# Patient Record
Sex: Male | Born: 1945 | Race: Black or African American | Hispanic: No | Marital: Single | Smoking: Current every day smoker
Health system: Southern US, Community
[De-identification: ages and names within clinical notes are randomized; demographics above are authoritative.]

## PROBLEM LIST (undated history)

## (undated) DIAGNOSIS — I255 Ischemic cardiomyopathy: Secondary | ICD-10-CM

## (undated) DIAGNOSIS — R7303 Prediabetes: Secondary | ICD-10-CM

## (undated) DIAGNOSIS — I10 Essential (primary) hypertension: Secondary | ICD-10-CM

## (undated) DIAGNOSIS — I2510 Atherosclerotic heart disease of native coronary artery without angina pectoris: Secondary | ICD-10-CM

## (undated) DIAGNOSIS — Z720 Tobacco use: Secondary | ICD-10-CM

## (undated) DIAGNOSIS — E785 Hyperlipidemia, unspecified: Secondary | ICD-10-CM

## (undated) HISTORY — PX: OTHER SURGICAL HISTORY: SHX169

## (undated) HISTORY — PX: FEMUR FRACTURE SURGERY: SHX633

## (undated) MED FILL — Fosaprepitant Dimeglumine For IV Infusion 150 MG (Base Eq): INTRAVENOUS | Qty: 5 | Status: AC

## (undated) MED FILL — Dexamethasone Sodium Phosphate Inj 100 MG/10ML: INTRAMUSCULAR | Qty: 1 | Status: AC

---

## 1998-10-07 ENCOUNTER — Encounter: Payer: Self-pay | Admitting: Emergency Medicine

## 1998-10-07 ENCOUNTER — Emergency Department (HOSPITAL_COMMUNITY): Admission: EM | Admit: 1998-10-07 | Discharge: 1998-10-07 | Payer: Self-pay | Admitting: Emergency Medicine

## 1998-10-19 ENCOUNTER — Emergency Department (HOSPITAL_COMMUNITY): Admission: EM | Admit: 1998-10-19 | Discharge: 1998-10-19 | Payer: Self-pay | Admitting: Emergency Medicine

## 1998-11-06 ENCOUNTER — Ambulatory Visit (HOSPITAL_BASED_OUTPATIENT_CLINIC_OR_DEPARTMENT_OTHER): Admission: RE | Admit: 1998-11-06 | Discharge: 1998-11-06 | Payer: Self-pay | Admitting: General Surgery

## 1998-11-12 ENCOUNTER — Emergency Department (HOSPITAL_COMMUNITY): Admission: EM | Admit: 1998-11-12 | Discharge: 1998-11-12 | Payer: Self-pay | Admitting: Emergency Medicine

## 2000-04-06 ENCOUNTER — Emergency Department (HOSPITAL_COMMUNITY): Admission: EM | Admit: 2000-04-06 | Discharge: 2000-04-06 | Payer: Self-pay | Admitting: Emergency Medicine

## 2001-03-27 ENCOUNTER — Emergency Department (HOSPITAL_COMMUNITY): Admission: EM | Admit: 2001-03-27 | Discharge: 2001-03-27 | Payer: Self-pay | Admitting: Emergency Medicine

## 2001-04-17 ENCOUNTER — Emergency Department (HOSPITAL_COMMUNITY): Admission: EM | Admit: 2001-04-17 | Discharge: 2001-04-17 | Payer: Self-pay | Admitting: Emergency Medicine

## 2001-09-07 ENCOUNTER — Emergency Department (HOSPITAL_COMMUNITY): Admission: EM | Admit: 2001-09-07 | Discharge: 2001-09-07 | Payer: Self-pay | Admitting: *Deleted

## 2002-06-25 ENCOUNTER — Emergency Department (HOSPITAL_COMMUNITY): Admission: EM | Admit: 2002-06-25 | Discharge: 2002-06-25 | Payer: Self-pay | Admitting: Emergency Medicine

## 2002-06-25 ENCOUNTER — Encounter: Payer: Self-pay | Admitting: Emergency Medicine

## 2003-03-11 ENCOUNTER — Emergency Department (HOSPITAL_COMMUNITY): Admission: EM | Admit: 2003-03-11 | Discharge: 2003-03-11 | Payer: Self-pay | Admitting: Emergency Medicine

## 2004-11-10 ENCOUNTER — Emergency Department (HOSPITAL_COMMUNITY): Admission: EM | Admit: 2004-11-10 | Discharge: 2004-11-10 | Payer: Self-pay | Admitting: Family Medicine

## 2005-03-11 ENCOUNTER — Encounter: Admission: RE | Admit: 2005-03-11 | Discharge: 2005-03-11 | Payer: Self-pay | Admitting: Family Medicine

## 2006-06-22 ENCOUNTER — Emergency Department (HOSPITAL_COMMUNITY): Admission: EM | Admit: 2006-06-22 | Discharge: 2006-06-22 | Payer: Self-pay | Admitting: Emergency Medicine

## 2007-10-25 ENCOUNTER — Ambulatory Visit (HOSPITAL_COMMUNITY): Admission: RE | Admit: 2007-10-25 | Discharge: 2007-10-25 | Payer: Self-pay | Admitting: Chiropractic Medicine

## 2013-07-17 ENCOUNTER — Emergency Department (HOSPITAL_COMMUNITY)
Admission: EM | Admit: 2013-07-17 | Discharge: 2013-07-17 | Disposition: A | Discharge status: Home / Self Care | Payer: Medicare PPO | Attending: Emergency Medicine | Admitting: Emergency Medicine

## 2013-07-17 ENCOUNTER — Encounter (HOSPITAL_COMMUNITY): Payer: Self-pay | Admitting: Emergency Medicine

## 2013-07-17 ENCOUNTER — Emergency Department (HOSPITAL_COMMUNITY): Payer: Medicare PPO

## 2013-07-17 DIAGNOSIS — F17200 Nicotine dependence, unspecified, uncomplicated: Secondary | ICD-10-CM | POA: Insufficient documentation

## 2013-07-17 DIAGNOSIS — M25579 Pain in unspecified ankle and joints of unspecified foot: Secondary | ICD-10-CM | POA: Insufficient documentation

## 2013-07-17 DIAGNOSIS — M25476 Effusion, unspecified foot: Secondary | ICD-10-CM | POA: Insufficient documentation

## 2013-07-17 DIAGNOSIS — M25571 Pain in right ankle and joints of right foot: Secondary | ICD-10-CM

## 2013-07-17 DIAGNOSIS — M25473 Effusion, unspecified ankle: Secondary | ICD-10-CM | POA: Insufficient documentation

## 2013-07-17 MED ORDER — HYDROCODONE-ACETAMINOPHEN 5-325 MG PO TABS: 1.0000 | ORAL_TABLET | ORAL | Status: DC | PRN

## 2013-07-17 NOTE — ED Notes (Signed)
Patient states he started having R ankle pain Sunday afternoon.   Patient states swelling along with pain.   Patient denies injury.   Patient denies any other symptoms.

## 2013-07-17 NOTE — Progress Notes (Signed)
Orthopedic Tech Progress Note Patient Details:  Frank Hart [birth year] [id number]  Ortho Devices Type of Ortho Device: ASO Ortho Device/Splint Interventions: Application   [name] 07/17/2013, 9:27 AM
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
## 2013-07-17 NOTE — ED Notes (Signed)
Per ortho tech, patient refused ASO - he has several at home.   Patient states he is ready to go home.

## 2013-07-17 NOTE — Progress Notes (Signed)
Orthopedic Tech Progress Note Patient Details:  Frank Hart 01-11-1946 [id number]  Patient ID: [name], male   DOB: 07/11/1946, 67 y.o.   MRN: [id number]   [name] 07/17/2013, 9:49 AMPatient refused ASO brace.

## 2014-05-21 ENCOUNTER — Emergency Department (HOSPITAL_COMMUNITY)
Admission: EM | Admit: 2014-05-21 | Discharge: 2014-05-21 | Disposition: A | Discharge status: Home / Self Care | Payer: Medicare PPO | Attending: Emergency Medicine | Admitting: Emergency Medicine

## 2014-05-21 ENCOUNTER — Emergency Department (HOSPITAL_COMMUNITY): Payer: Medicare PPO

## 2014-05-21 ENCOUNTER — Encounter (HOSPITAL_COMMUNITY): Payer: Self-pay | Admitting: Emergency Medicine

## 2014-05-21 DIAGNOSIS — M25512 Pain in left shoulder: Secondary | ICD-10-CM

## 2014-05-21 DIAGNOSIS — F17200 Nicotine dependence, unspecified, uncomplicated: Secondary | ICD-10-CM | POA: Insufficient documentation

## 2014-05-21 DIAGNOSIS — R0602 Shortness of breath: Secondary | ICD-10-CM | POA: Insufficient documentation

## 2014-05-21 DIAGNOSIS — M79609 Pain in unspecified limb: Secondary | ICD-10-CM | POA: Insufficient documentation

## 2014-05-21 DIAGNOSIS — M10071 Idiopathic gout, right ankle and foot: Secondary | ICD-10-CM

## 2014-05-21 DIAGNOSIS — M109 Gout, unspecified: Secondary | ICD-10-CM | POA: Insufficient documentation

## 2014-05-21 LAB — BASIC METABOLIC PANEL
BUN: 11 mg/dL (ref 6–23)
CALCIUM: 9.6 mg/dL (ref 8.4–10.5)
CO2: 24 meq/L (ref 19–32)
Chloride: 102 mEq/L (ref 96–112)
Creatinine, Ser: 1.48 mg/dL — ABNORMAL HIGH (ref 0.50–1.35)
GFR, EST AFRICAN AMERICAN: 54 mL/min — AB (ref >90)
GFR, EST NON AFRICAN AMERICAN: 47 mL/min — AB (ref >90)
Glucose, Bld: 152 mg/dL — ABNORMAL HIGH (ref 70–99)
Potassium: 4.8 mEq/L (ref 3.7–5.3)
SODIUM: 141 meq/L (ref 137–147)

## 2014-05-21 LAB — CBC WITH DIFFERENTIAL/PLATELET
BASOS ABS: 0 10*3/uL (ref 0.0–0.1)
Basophils Relative: 0 % (ref 0–1)
EOS ABS: 0.1 10*3/uL (ref 0.0–0.7)
EOS PCT: 1 % (ref 0–5)
HCT: 45.8 % (ref 39.0–52.0)
Hemoglobin: 15.1 g/dL (ref 13.0–17.0)
LYMPHS PCT: 19 % (ref 12–46)
Lymphs Abs: 2.5 10*3/uL (ref 0.7–4.0)
MCH: 28.1 pg (ref 26.0–34.0)
MCHC: 33 g/dL (ref 30.0–36.0)
MCV: 85.3 fL (ref 78.0–100.0)
Monocytes Absolute: 1.3 10*3/uL — ABNORMAL HIGH (ref 0.1–1.0)
Monocytes Relative: 10 % (ref 3–12)
Neutro Abs: 9.3 10*3/uL — ABNORMAL HIGH (ref 1.7–7.7)
Neutrophils Relative %: 70 % (ref 43–77)
PLATELETS: 222 10*3/uL (ref 150–400)
RBC: 5.37 MIL/uL (ref 4.22–5.81)
RDW: 13.7 % (ref 11.5–15.5)
WBC: 13.2 10*3/uL — AB (ref 4.0–10.5)

## 2014-05-21 LAB — SEDIMENTATION RATE: SED RATE: 13 mm/h (ref 0–16)

## 2014-05-21 MED ORDER — NAPROXEN 500 MG PO TABS: 500.0000 mg | ORAL_TABLET | Freq: Two times a day (BID) | ORAL | Status: DC

## 2014-05-21 MED ORDER — OXYCODONE-ACETAMINOPHEN 5-325 MG PO TABS
2.0000 | ORAL_TABLET | Freq: Once | ORAL | Status: AC
  Administered 2014-05-21: 2 via ORAL
  Filled 2014-05-21: qty 2

## 2014-05-21 MED ORDER — COLCHICINE 0.6 MG PO TABS
1.2000 mg | ORAL_TABLET | Freq: Once | ORAL | Status: AC
  Administered 2014-05-21: 1.2 mg via ORAL
  Filled 2014-05-21: qty 2

## 2014-05-21 MED ORDER — HYDROCODONE-ACETAMINOPHEN 5-325 MG PO TABS: 2.0000 | ORAL_TABLET | ORAL | Status: DC | PRN

## 2014-05-21 NOTE — Discharge Planning (Signed)
[facility] Liaison  PA asked if I could speak with the patient to assist in finding a primary care provider. Patient was given a list of providers in the area who accept his insurance. I also informed patient to contact some of the practices on the list to see which one's are accepting new patients. My contact information was also provided for any future questions or concerns.

## 2014-05-21 NOTE — Discharge Instructions (Signed)
Take Vicodin as needed for pain. Take Naprosyn as needed for gout flare. You may take these medications together. Refer to attached documents for more information. Follow up with Dr. Tonita Cong for further evaluation of your shoulder pain.    Emergency Department Resource Guide 1) Find a Doctor and Pay Out of Pocket Although you won't have to find out who is covered by your insurance plan, it is a good idea to ask around and get recommendations. You will then need to call the office and see if the doctor you have chosen will accept you as a new patient and what types of options they offer for patients who are self-pay. Some doctors offer discounts or will set up payment plans for their patients who do not have insurance, but you will need to ask so you aren't surprised when you get to your appointment.  2) Contact Your Local Health Department Not all health departments have doctors that can see patients for sick visits, but many do, so it is worth a call to see if yours does. If you don't know where your local health department is, you can check in your phone book. The CDC also has a tool to help you locate your state's health department, and many state websites also have listings of all of their local health departments.  3) Find a [facility] If your illness is not likely to be very severe or complicated, you may want to try a walk in clinic. These are popping up all over the country in pharmacies, drugstores, and shopping centers. They're usually staffed by nurse practitioners or physician assistants that have been trained to treat common illnesses and complaints. They're usually fairly quick and inexpensive. However, if you have serious medical issues or chronic medical problems, these are probably not your best option.  No Primary Care Doctor: - Call Health Connect at  [phone] - they can help you locate a primary care doctor that  accepts your insurance, provides certain services, etc. - Physician  Referral Service- [phone]  Chronic Pain Problems: Organization         Address  Phone   Notes  [facility]  [phone] Patients need to be referred by their primary care doctor.   Medication Assistance: Organization         Address  Phone   Notes  [facility] Medication [facility] [address] [place], [place] [address] [phone] --Must be a resident of [facility] -- Must have NO insurance coverage whatsoever (no Medicaid/ Medicare, etc.) -- The pt. MUST have a primary care doctor that directs their care regularly and follows them in the community   MedAssist  [phone]   [employer]  [phone]    Agencies that provide inexpensive medical care: Organization         Address  Phone   Notes  [place]  [phone]   [facility]    [phone]   [facility] [place], [place] [address] [phone]   [place] [address], [place] [phone]   Planned Parenthood    [phone]   [facility]    [phone]   [place] and [place] [address],  Phone:  [phone], Fax:  [phone] Hours of Operation:  9 am - 6 pm, M-F.  Also accepts Medicaid/Medicare and self-pay.  [facility] School for [place], [address], [place] Phone: [phone], Fax: [phone]. Hours of Operation:  8:30 am - 5:30 pm, M-F.  Also accepts Medicaid and self-pay.  [facility] [address] [place] Phone: [phone]   [place], [place], [place] [phone], Ext. [phone] Mondays & Thursdays: 7-9 AM.  First 15 patients are seen on a first come, first serve basis.    [place] Providers:  Organization         Address  Phone   Notes  [facility] [address], [place] [phone] Also accepts self-pay patients.  [facility] [address], [place]  [phone]   [place], [address], [place] [phone]   [facility] [address], [place] [phone]   Lucianne Lei [address], [place]   [phone] Only accepts [place] Access [place] patients after they have their name applied to their card.   Self-Pay (no insurance) in [facility]:  Organization         Address  Phone   Notes  Sickle Cell Patients, [facility] Internal Medicine [place] [phone]   [facility]gent Care [place] [phone]   [facility] [place]  [place], [place], [place] [phone]   [facility]/Dr. Osei-Bonsu  [address] [place] or [address], [place] [phone] Phone number for both [place] and [place] locations is the same.  Urgent Medical and [facility] [address] [place] [phone]   [facility] [address] [place] or [address] [phone]   [facility] [address] [place] [phone], phone; [phone], fax Sees patients 1st and 3rd Saturday of every month.  Must not qualify for public or private insurance (i.e. Medicaid, Medicare, [place] Health Choice, Veterans' Benefits)  Household income should be no more than 200% of the poverty level The clinic cannot treat you if you are pregnant or think you are pregnant  Sexually transmitted diseases are not treated at the clinic.    Dental Care: Organization         Address  Phone  Notes  [facility] Department of [facility] [place] [phone] Accepts children up to age 84 who are enrolled  in [place] or [place]; pregnant women with a Medicaid card; and children who have applied for Medicaid or [place] Health Choice, but were declined, whose parents can pay a reduced fee at time of service.  [facility] Department of [facility]  [address], [place] [phone] Accepts children up to age 33 who are enrolled in [place] or [place]; pregnant women with a Medicaid card; and children who have applied for Medicaid or [place] Health Choice, but were declined, whose parents can pay a reduced fee at time of service.  [place] Adult Dental Access PROGRAM  [place] [phone] Patients are seen by appointment only. Walk-ins are not accepted. [place] will see patients 49 years of age and older. Monday - Tuesday (8am-5pm) Most Wednesdays (8:30-5pm) $30 per  visit, cash only  [facility] Adult [employer] PROGRAM  [address], [facility] [phone] Patients are seen by appointment only. Walk-ins are not accepted. [place] will see patients 75 years of age and older. One Wednesday Evening (Monthly: Volunteer Based).  $30 per visit, cash only  [place]  [phone] for adults; Children under age 64, call Graduate Pediatric Dentistry at [phone]. Children aged 33-14, please call [phone] to request a pediatric application.  Dental services are provided in all areas of dental care including fillings, crowns and bridges, complete and partial dentures, implants, gum treatment, root canals, and extractions. Preventive care is also provided. Treatment is provided to both adults and children. Patients are selected via a lottery and there is often a waiting list.   [facility] [address], [place]  [phone] [email or web address]   [facility] [address] [place], [place] [phone], Ext. [phone] Second and Fourth Thursday of each month, opens at  6:30 AM; Clinic ends at 9 AM.  Patients are seen on a first-come first-served basis, and a limited number are seen during each clinic.   [facility]  [address] Hillard Danker [place], [place] [phone]   Eligibility Requirements You must have lived in [place], [place], or [place] counties for at least the last three months.   You cannot be eligible for state or federal sponsored [employer], including [employer], [place], or [employer].   You generally cannot be eligible for healthcare insurance through your employer.    How to apply: Eligibility screenings are held every Tuesday and Wednesday afternoon from 1:00 pm until 4:00 pm. You do not need an appointment for the interview!  [facility] [address], [place], [place]   [place]  [place] Department  [place]  [phone]    Behavioral Health Resources in the Community: Intensive Outpatient Programs Organization         Address  Phone  Notes  [place]. [address] [place], [place] [phone]   [facility] Outpatient [address], [place], [place]   ADS: Alcohol & Drug Svcs [address], [place], [place]   [place] [address]  [place], [place] or [phone]   Substance Abuse Resources Organization         Address  Phone  Notes  Alcohol and Drug Services  [phone]   [place]  [phone]   The [place]   Chinita Pester  [phone]   Residential & Outpatient Substance Abuse Program  2205823815   Psychological Services Organization         Address  Phone  Notes  [facility]  [place]  [phone]   [place] [address], [place] or [phone]    Mobile Crisis Teams Organization         Address  Phone  Notes  [facility], Mobile Crisis Care Unit  [phone]   [facility]ive Psychotherapeutic Services  [address] [place], [place]   [place] Levels [address] [place] [phone]    Self-Help/Support Groups Organization         Address  Phone  Notes  Mental Health Assoc. of [place] - variety of support groups  [place] Call for more information  Narcotics Anonymous (NA), [facility] [address], [employer] [place]  2 meetings at this location   Special educational needs teacher         Address  Phone  Notes  ASAP Residential Treatment [place],    [place]  [phone]   [facility]  [address], [place] [phone], [place], [place]   [place], [place] [phone] Admissions: 8am-3pm M-F  Incentives Substance [place] [address],    [place], [place] [phone]   [facility] [address] [place], [place], [place]   [facility] [address],  [place], [place]   [facility] Programs - Intensive Outpatient [address] Kristeen [address], [place], [place]   [facility] ([place] [address].,  [place], [place] [phone] or [phone]   [facility]idential Treatment Services (RTS) [address] [place], [place] Accepts Medicaid  [place] [address]  [place] [phone] Substance Abuse/Addiction Treatment   [facility] Organization         Address  Phone  Notes  [facility]  [phone]   Domenic Schwab, PhD [address] Arlis Porta [place], [place]   [phone] or [phone]   [place], [place] [phone]   [facility]  [address] [place], [place] [phone] Insurance/Medicaid/sponsorship through [facility] and Families [address], [address] [place]                                    [place], [place] [phone] [place] [address], [place] [phone]    Dr. Adele Schilder  [phone]   [facility] Dept. 1) [address], [place] 2) [place] 3)  [place] [address], [place] [phone]  [phone]   [place] [phone] or [phone] (After Hours)
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
## 2014-05-21 NOTE — ED Notes (Signed)
Typo previously.   Patient went out by wheelchair, not ambulatory.

## 2014-05-21 NOTE — ED Provider Notes (Signed)
Medical screening examination/treatment/procedure(s) were performed by non-physician practitioner and as supervising physician I was immediately available for consultation/collaboration.   EKG Interpretation None       Threasa Beards, MD 05/21/14 [phone]

## 2014-05-21 NOTE — ED Notes (Signed)
Patient states he started having bilateral leg pain, bilateral foot pain and L shoulder pain.  Patient states he started having to use a cane starting Saturday because of pain.

## 2014-08-14 ENCOUNTER — Ambulatory Visit (INDEPENDENT_AMBULATORY_CARE_PROVIDER_SITE_OTHER): Payer: 59 | Admitting: Internal Medicine

## 2014-08-14 ENCOUNTER — Encounter: Payer: Self-pay | Admitting: Internal Medicine

## 2014-08-14 VITALS — BP 132/84 | HR 77 | Temp 98.3°F | Resp 10 | Ht 70.0 in | Wt 176.0 lb

## 2014-08-14 DIAGNOSIS — M25579 Pain in unspecified ankle and joints of unspecified foot: Secondary | ICD-10-CM

## 2014-08-14 DIAGNOSIS — N289 Disorder of kidney and ureter, unspecified: Secondary | ICD-10-CM | POA: Insufficient documentation

## 2014-08-14 DIAGNOSIS — M109 Gout, unspecified: Secondary | ICD-10-CM
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
## 2014-08-15 LAB — LIPID PANEL
CHOLESTEROL TOTAL: 247 mg/dL — AB (ref 100–199)
Chol/HDL Ratio: 3.3 ratio units (ref 0.0–5.0)
HDL: 76 mg/dL (ref >39)
LDL Calculated: 141 mg/dL — ABNORMAL HIGH (ref 0–99)
Triglycerides: 148 mg/dL (ref 0–149)
VLDL CHOLESTEROL CAL: 30 mg/dL (ref 5–40)

## 2014-08-15 LAB — CBC WITH DIFFERENTIAL/PLATELET
Basophils Absolute: 0 10*3/uL (ref 0.0–0.2)
Basos: 0 %
EOS: 1 %
Eosinophils Absolute: 0.1 10*3/uL (ref 0.0–0.4)
HCT: 44.1 % (ref 37.5–51.0)
Hemoglobin: 14.8 g/dL (ref 12.6–17.7)
IMMATURE GRANS (ABS): 0 10*3/uL (ref 0.0–0.1)
IMMATURE GRANULOCYTES: 0 %
LYMPHS: 26 %
Lymphocytes Absolute: 2.5 10*3/uL (ref 0.7–3.1)
MCH: 27.4 pg (ref 26.6–33.0)
MCHC: 33.6 g/dL (ref 31.5–35.7)
MCV: 82 fL (ref 79–97)
MONOCYTES: 9 %
Monocytes Absolute: 0.9 10*3/uL (ref 0.1–0.9)
NEUTROS PCT: 64 %
Neutrophils Absolute: 6.4 10*3/uL (ref 1.4–7.0)
RBC: 5.41 x10E6/uL (ref 4.14–5.80)
RDW: 13.7 % (ref 12.3–15.4)
WBC: 10 10*3/uL (ref 3.4–10.8)

## 2014-08-15 LAB — COMPREHENSIVE METABOLIC PANEL
ALBUMIN: 4.6 g/dL (ref 3.6–4.8)
ALT: 20 IU/L (ref 0–44)
AST: 15 IU/L (ref 0–40)
Albumin/Globulin Ratio: 1.6 (ref 1.1–2.5)
Alkaline Phosphatase: 75 IU/L (ref 39–117)
BUN/Creatinine Ratio: 11 (ref 10–22)
BUN: 15 mg/dL (ref 8–27)
CALCIUM: 9.8 mg/dL (ref 8.6–10.2)
CHLORIDE: 105 mmol/L (ref 97–108)
CO2: 22 mmol/L (ref 18–29)
CREATININE: 1.32 mg/dL — AB (ref 0.76–1.27)
GFR calc Af Amer: 64 mL/min/{1.73_m2} (ref >59)
GFR calc non Af Amer: 55 mL/min/{1.73_m2} — ABNORMAL LOW (ref >59)
GLOBULIN, TOTAL: 2.8 g/dL (ref 1.5–4.5)
Glucose: 117 mg/dL — ABNORMAL HIGH (ref 65–99)
Potassium: 4.5 mmol/L (ref 3.5–5.2)
SODIUM: 143 mmol/L (ref 134–144)
Total Bilirubin: 0.7 mg/dL (ref 0.0–1.2)
Total Protein: 7.4 g/dL (ref 6.0–8.5)

## 2014-08-15 LAB — VITAMIN D 1,25 DIHYDROXY: VIT D 1 25 DIHYDROXY: 40.4 pg/mL (ref 19.9–79.3)

## 2014-08-15 LAB — URIC ACID: URIC ACID: 10.2 mg/dL — AB (ref 3.7–8.6)

## 2014-08-27 ENCOUNTER — Ambulatory Visit: Payer: Self-pay | Admitting: Internal Medicine

## 2014-09-18 ENCOUNTER — Ambulatory Visit: Payer: Self-pay | Admitting: Internal Medicine

## 2014-10-01 ENCOUNTER — Encounter: Payer: Self-pay | Admitting: Internal Medicine

## 2014-10-01 ENCOUNTER — Ambulatory Visit (INDEPENDENT_AMBULATORY_CARE_PROVIDER_SITE_OTHER): Payer: 59 | Admitting: Internal Medicine

## 2014-10-01 VITALS — BP 116/78 | HR 80 | Temp 98.4°F | Resp 10 | Ht 70.0 in | Wt 180.0 lb

## 2014-10-01 DIAGNOSIS — M109 Gout, unspecified: Secondary | ICD-10-CM

## 2014-10-01 DIAGNOSIS — E785 Hyperlipidemia, unspecified: Secondary | ICD-10-CM

## 2014-10-01 DIAGNOSIS — N182 Chronic kidney disease, stage 2 (mild): Secondary | ICD-10-CM

## 2014-10-01 DIAGNOSIS — R739 Hyperglycemia, unspecified: Secondary | ICD-10-CM

## 2014-10-01 MED ORDER — ATORVASTATIN CALCIUM 10 MG PO TABS: 10.0000 mg | ORAL_TABLET | Freq: Every day | ORAL | Status: DC

## 2014-10-01 MED ORDER — ALLOPURINOL 100 MG PO TABS: 100.0000 mg | ORAL_TABLET | Freq: Every day | ORAL | Status: DC

## 2014-10-01 NOTE — Progress Notes (Signed)
Patient ID: Frank Hart, male   DOB: 1946/02/28, 68 y.o.   MRN: [id number]    Chief Complaint  Patient presents with  . Follow-up    Discuss lab (copy printed)    Allergies  Allergen Reactions  . Asa [Aspirin]     Trigger stomach issues    HPI 68 y/o male patient is here for lab results. Has elevated uric acid level ad deranged ldl on lab review. No recent gout flare up. Continues to smoke. Has sedentary lifestyle. Denies any concerns this visit.  ROS Denies fever or chills Denies chest pain and dyspnea Denies headache and blurred vision No falls reported No skin concerns Normal bowel habits No urinary complaints  History reviewed. No pertinent past medical history. No current outpatient prescriptions on file prior to visit.   No current facility-administered medications on file prior to visit.    Physical exam BP 116/78 mmHg  Pulse 80  Temp(Src) 98.4 F (36.9 C) (Oral)  Resp 10  Ht 5\' 10"  (1.778 m)  Wt 180 lb (81.647 kg)  BMI 25.83 kg/m2  SpO2 96%  General- elderly male in no acute distress Throat- moist mucus membrane, normal oropharynx Cardiovascular- normal s1,s2, no murmurs Respiratory- bilateral clear to auscultation, no wheeze, no rhonchi, no crackles, no use of accessory muscles Abdomen- bowel sounds present, soft, non tender Neurological- no focal deficit Psychiatry- alert and oriented to person, place and time  Labs- Lab Results  Component Value Date   URICACID 10.2* 08/14/2014   Lipid Panel     Component Value Date/Time   TRIG 148 08/14/2014 1137   HDL 76 08/14/2014 1137   CHOLHDL 3.3 08/14/2014 1137   LDLCALC 141* 08/14/2014 1137   CMP     Component Value Date/Time   NA 143 08/14/2014 1137   NA 141 05/21/2014 1020   K 4.5 08/14/2014 1137   CL 105 08/14/2014 1137   CO2 22 08/14/2014 1137   GLUCOSE 117* 08/14/2014 1137   GLUCOSE 152* 05/21/2014 1020   BUN 15 08/14/2014 1137   BUN 11 05/21/2014 1020   CREATININE 1.32* 08/14/2014  1137   CALCIUM 9.8 08/14/2014 1137   PROT 7.4 08/14/2014 1137   AST 15 08/14/2014 1137   ALT 20 08/14/2014 1137   ALKPHOS 75 08/14/2014 1137   BILITOT 0.7 08/14/2014 1137   GFRNONAA 55* 08/14/2014 1137   GFRAA 64 08/14/2014 1137   Assessment/plan  1. Hyperlipidemia LDL goal <130 Start lipitor 10 mg daily, diet and exercise counselling provided, encouraged to stop smoking, pt willing to try cold [place] way, common side effects with medication explained - CMP - atorvastatin (LIPITOR) 10 MG tablet; Take 1 tablet (10 mg total) by mouth daily.  Dispense: 30 tablet; Refill: 3  2. Gout without tophus, unspecified cause, unspecified chronicity, unspecified site No recent flare. Introduce allopurinol 100 mg po daily. Recheck uric acid level  - Uric Acid; Future - allopurinol (ZYLOPRIM) 100 MG tablet; Take 1 tablet (100 mg total) by mouth daily.  Dispense: 30 tablet; Refill: 6  3. Hyperglycemia Check a1c to r/o DM - Hemoglobin A1c; Future - CMP  4. CKD (chronic kidney disease) stage 2, GFR 60-89 ml/min Avoid NSAIDs, encouraged hydration. Monitor clinically - CMP

## 2014-11-25 ENCOUNTER — Other Ambulatory Visit: Payer: 59

## 2014-11-27 ENCOUNTER — Encounter: Payer: 59 | Admitting: Internal Medicine

## 2015-01-21 ENCOUNTER — Encounter: Payer: Self-pay | Admitting: Internal Medicine

## 2015-02-28 ENCOUNTER — Other Ambulatory Visit: Payer: 59

## 2015-03-04 ENCOUNTER — Encounter: Payer: 59 | Admitting: Internal Medicine

## 2015-03-07 ENCOUNTER — Encounter: Payer: 59 | Admitting: Internal Medicine

## 2015-10-08 ENCOUNTER — Telehealth: Payer: Self-pay | Admitting: Internal Medicine

## 2015-10-08 ENCOUNTER — Encounter: Payer: Self-pay | Admitting: Internal Medicine

## 2015-10-08 NOTE — Telephone Encounter (Signed)
Patients last office visit was 2015-  Called pt to schedule appointment n/a.  Sent request to schedule appointment. cdavis

## 2015-10-11 ENCOUNTER — Emergency Department (HOSPITAL_COMMUNITY): Payer: Medicare PPO

## 2015-10-11 ENCOUNTER — Inpatient Hospital Stay (HOSPITAL_COMMUNITY)
Admission: EM | Admit: 2015-10-11 | Discharge: 2015-10-14 | DRG: 247 | Disposition: A | Discharge status: Home / Self Care | Payer: Medicare PPO | Attending: Cardiology | Admitting: Cardiology

## 2015-10-11 ENCOUNTER — Encounter (HOSPITAL_COMMUNITY): Payer: Self-pay

## 2015-10-11 DIAGNOSIS — Z888 Allergy status to other drugs, medicaments and biological substances status: Secondary | ICD-10-CM | POA: Diagnosis not present

## 2015-10-11 DIAGNOSIS — Z8249 Family history of ischemic heart disease and other diseases of the circulatory system: Secondary | ICD-10-CM | POA: Diagnosis not present

## 2015-10-11 DIAGNOSIS — I2510 Atherosclerotic heart disease of native coronary artery without angina pectoris: Secondary | ICD-10-CM | POA: Diagnosis present

## 2015-10-11 DIAGNOSIS — F17200 Nicotine dependence, unspecified, uncomplicated: Secondary | ICD-10-CM | POA: Diagnosis present

## 2015-10-11 DIAGNOSIS — R079 Chest pain, unspecified: Secondary | ICD-10-CM | POA: Diagnosis not present

## 2015-10-11 DIAGNOSIS — N179 Acute kidney failure, unspecified: Secondary | ICD-10-CM | POA: Diagnosis not present

## 2015-10-11 DIAGNOSIS — I255 Ischemic cardiomyopathy: Secondary | ICD-10-CM | POA: Diagnosis present

## 2015-10-11 DIAGNOSIS — R001 Bradycardia, unspecified: Secondary | ICD-10-CM | POA: Diagnosis present

## 2015-10-11 DIAGNOSIS — I214 Non-ST elevation (NSTEMI) myocardial infarction: Secondary | ICD-10-CM | POA: Diagnosis present

## 2015-10-11 DIAGNOSIS — R7303 Prediabetes: Secondary | ICD-10-CM | POA: Diagnosis present

## 2015-10-11 DIAGNOSIS — Z720 Tobacco use: Secondary | ICD-10-CM

## 2015-10-11 DIAGNOSIS — I10 Essential (primary) hypertension: Secondary | ICD-10-CM | POA: Diagnosis present

## 2015-10-11 DIAGNOSIS — Z79899 Other long term (current) drug therapy: Secondary | ICD-10-CM

## 2015-10-11 DIAGNOSIS — R0789 Other chest pain: Secondary | ICD-10-CM | POA: Diagnosis not present

## 2015-10-11 DIAGNOSIS — E785 Hyperlipidemia, unspecified: Secondary | ICD-10-CM | POA: Insufficient documentation

## 2015-10-11 DIAGNOSIS — R531 Weakness: Secondary | ICD-10-CM | POA: Diagnosis not present

## 2015-10-11 HISTORY — DX: Prediabetes: R73.03

## 2015-10-11 HISTORY — DX: Atherosclerotic heart disease of native coronary artery without angina pectoris: I25.10

## 2015-10-11 HISTORY — DX: Ischemic cardiomyopathy: I25.5

## 2015-10-11 HISTORY — DX: Essential (primary) hypertension: I10

## 2015-10-11 HISTORY — DX: Hyperlipidemia, unspecified: E78.5

## 2015-10-11 HISTORY — DX: Tobacco use: Z72.0

## 2015-10-11 LAB — BASIC METABOLIC PANEL WITH GFR
Anion gap: 10 (ref 5–15)
BUN: 10 mg/dL (ref 6–20)
CO2: 26 mmol/L (ref 22–32)
Calcium: 9.2 mg/dL (ref 8.9–10.3)
Chloride: 104 mmol/L (ref 101–111)
Creatinine, Ser: 1.25 mg/dL — ABNORMAL HIGH (ref 0.61–1.24)
GFR calc Af Amer: >60 mL/min
GFR calc non Af Amer: 57 mL/min — ABNORMAL LOW
Glucose, Bld: 122 mg/dL — ABNORMAL HIGH (ref 65–99)
Potassium: 3.9 mmol/L (ref 3.5–5.1)
Sodium: 140 mmol/L (ref 135–145)

## 2015-10-11 LAB — CBC
HCT: 41.7 % (ref 39.0–52.0)
Hemoglobin: 13.5 g/dL (ref 13.0–17.0)
MCH: 27.4 pg (ref 26.0–34.0)
MCHC: 32.4 g/dL (ref 30.0–36.0)
MCV: 84.6 fL (ref 78.0–100.0)
Platelets: 210 K/uL (ref 150–400)
RBC: 4.93 MIL/uL (ref 4.22–5.81)
RDW: 14 % (ref 11.5–15.5)
WBC: 11.7 K/uL — ABNORMAL HIGH (ref 4.0–10.5)

## 2015-10-11 LAB — PROTIME-INR
INR: 1.03 (ref 0.00–1.49)
Prothrombin Time: 13.7 s (ref 11.6–15.2)

## 2015-10-11 LAB — I-STAT TROPONIN, ED: TROPONIN I, POC: 0.89 ng/mL — AB (ref 0.00–0.08)

## 2015-10-11 MED ORDER — MORPHINE SULFATE (PF) 2 MG/ML IV SOLN
2.0000 mg | Freq: Once | INTRAVENOUS | Status: AC
  Administered 2015-10-11: 2 mg via INTRAVENOUS
  Filled 2015-10-11: qty 1

## 2015-10-11 MED ORDER — ASPIRIN 81 MG PO CHEW
324.0000 mg | CHEWABLE_TABLET | Freq: Once | ORAL | Status: AC
  Administered 2015-10-11: 324 mg via ORAL
  Filled 2015-10-11: qty 4

## 2015-10-11 MED ORDER — HEPARIN BOLUS VIA INFUSION
4000.0000 [IU] | Freq: Once | INTRAVENOUS | Status: AC
  Administered 2015-10-11: 4000 [IU] via INTRAVENOUS
  Filled 2015-10-11: qty 4000

## 2015-10-11 MED ORDER — HEPARIN (PORCINE) IN NACL 100-0.45 UNIT/ML-% IJ SOLN
900.0000 [IU]/h | INTRAMUSCULAR | Status: DC
  Administered 2015-10-11: 900 [IU]/h via INTRAVENOUS
  Filled 2015-10-11: qty 250

## 2015-10-11 MED ORDER — NITROGLYCERIN 0.4 MG SL SUBL
0.4000 mg | SUBLINGUAL_TABLET | SUBLINGUAL | Status: DC | PRN
  Administered 2015-10-11: 0.4 mg via SUBLINGUAL
  Filled 2015-10-11: qty 1

## 2015-10-11 MED ORDER — HEPARIN SODIUM (PORCINE) 5000 UNIT/ML IJ SOLN
4000.0000 [IU] | Freq: Once | INTRAMUSCULAR | Status: DC
Start: 2015-10-11 — End: 2015-10-12

## 2015-10-11 NOTE — ED Notes (Signed)
MD at bedside. 

## 2015-10-11 NOTE — ED Notes (Signed)
Pt here for central, pressure like CP, onset yesterday evening while sitting down. He states the pain makes him feel like he has to pass out and lightheaded but he has not passed out. Associated symptoms include SOB and sweating, denies N/V.
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
## 2015-10-11 NOTE — Progress Notes (Signed)
ANTICOAGULATION CONSULT NOTE - Initial Consult  Pharmacy Consult for Heparin Indication: chest pain/ACS  Allergies  Allergen Reactions  . Asa [Aspirin]     Trigger stomach issues     Patient Measurements: Height: '5\' 11"'$  (180.3 cm) Weight: 170 lb (77.111 kg) IBW/kg (Calculated) : 75.3  Vital Signs: Temp: 98.2 F (36.8 C) (11/12 2014) Temp Source: Oral (11/12 2014) BP: 112/77 mmHg (11/12 2050) Pulse Rate: 72 (11/12 2053)  Labs:  Recent Labs  10/11/15 2016  HGB 13.5  HCT 41.7  PLT 210  LABPROT 13.7  INR 1.03  CREATININE 1.25*    Estimated Creatinine Clearance: 58.6 mL/min (by C-G formula based on Cr of 1.25).   Medical History: History reviewed. No pertinent past medical history.  Medications:  Allopurinol  Lipitor  Assessment: 69 y.o. male with chest pain for heparin  Goal of Therapy:  Heparin level 0.3-0.7 units/ml Monitor platelets by anticoagulation protocol: Yes   Plan:  Heparin 4000 units IV bolus, then start heparin 900 units/hr Follow-up am labs.   Galo Sayed, Bronson Curb 10/11/2015,11:15 PM

## 2015-10-12 DIAGNOSIS — Z79899 Other long term (current) drug therapy: Secondary | ICD-10-CM | POA: Diagnosis not present

## 2015-10-12 DIAGNOSIS — F17200 Nicotine dependence, unspecified, uncomplicated: Secondary | ICD-10-CM | POA: Diagnosis present

## 2015-10-12 DIAGNOSIS — E785 Hyperlipidemia, unspecified: Secondary | ICD-10-CM | POA: Diagnosis not present

## 2015-10-12 DIAGNOSIS — I10 Essential (primary) hypertension: Secondary | ICD-10-CM | POA: Diagnosis present

## 2015-10-12 DIAGNOSIS — N179 Acute kidney failure, unspecified: Secondary | ICD-10-CM | POA: Diagnosis not present

## 2015-10-12 DIAGNOSIS — I255 Ischemic cardiomyopathy: Secondary | ICD-10-CM | POA: Diagnosis present

## 2015-10-12 DIAGNOSIS — Z888 Allergy status to other drugs, medicaments and biological substances status: Secondary | ICD-10-CM | POA: Diagnosis not present

## 2015-10-12 DIAGNOSIS — R001 Bradycardia, unspecified: Secondary | ICD-10-CM | POA: Diagnosis present

## 2015-10-12 DIAGNOSIS — I2510 Atherosclerotic heart disease of native coronary artery without angina pectoris: Secondary | ICD-10-CM | POA: Diagnosis not present

## 2015-10-12 DIAGNOSIS — R7303 Prediabetes: Secondary | ICD-10-CM | POA: Diagnosis present

## 2015-10-12 DIAGNOSIS — I214 Non-ST elevation (NSTEMI) myocardial infarction: Secondary | ICD-10-CM | POA: Diagnosis present

## 2015-10-12 DIAGNOSIS — R079 Chest pain, unspecified: Secondary | ICD-10-CM | POA: Diagnosis not present

## 2015-10-12 DIAGNOSIS — Z8249 Family history of ischemic heart disease and other diseases of the circulatory system: Secondary | ICD-10-CM | POA: Diagnosis not present

## 2015-10-12 LAB — MRSA PCR SCREENING: MRSA by PCR: NEGATIVE

## 2015-10-12 LAB — CBC
HEMATOCRIT: 41.1 % (ref 39.0–52.0)
Hemoglobin: 12.9 g/dL — ABNORMAL LOW (ref 13.0–17.0)
MCH: 26.9 pg (ref 26.0–34.0)
MCHC: 31.4 g/dL (ref 30.0–36.0)
MCV: 85.8 fL (ref 78.0–100.0)
PLATELETS: 215 10*3/uL (ref 150–400)
RBC: 4.79 MIL/uL (ref 4.22–5.81)
RDW: 14.2 % (ref 11.5–15.5)
WBC: 10.7 10*3/uL — AB (ref 4.0–10.5)

## 2015-10-12 LAB — LIPID PANEL
Cholesterol: 211 mg/dL — ABNORMAL HIGH (ref 0–200)
HDL: 72 mg/dL (ref >40)
LDL CALC: 114 mg/dL — AB (ref 0–99)
TRIGLYCERIDES: 126 mg/dL (ref <150)
Total CHOL/HDL Ratio: 2.9 RATIO
VLDL: 25 mg/dL (ref 0–40)

## 2015-10-12 LAB — TROPONIN I
TROPONIN I: 5.13 ng/mL — AB (ref <0.031)
Troponin I: 6.48 ng/mL (ref <0.031)

## 2015-10-12 LAB — BRAIN NATRIURETIC PEPTIDE: B NATRIURETIC PEPTIDE 5: 230.1 pg/mL — AB (ref 0.0–100.0)

## 2015-10-12 LAB — HEPARIN LEVEL (UNFRACTIONATED)
HEPARIN UNFRACTIONATED: 0.21 [IU]/mL — AB (ref 0.30–0.70)
Heparin Unfractionated: 0.24 IU/mL — ABNORMAL LOW (ref 0.30–0.70)

## 2015-10-12 MED ORDER — CLOPIDOGREL BISULFATE 75 MG PO TABS
75.0000 mg | ORAL_TABLET | Freq: Every day | ORAL | Status: DC
  Administered 2015-10-13 – 2015-10-14 (×2): 75 mg via ORAL
  Filled 2015-10-12 (×2): qty 1

## 2015-10-12 MED ORDER — ATORVASTATIN CALCIUM 40 MG PO TABS
40.0000 mg | ORAL_TABLET | Freq: Every day | ORAL | Status: DC
  Administered 2015-10-12 – 2015-10-13 (×2): 40 mg via ORAL
  Filled 2015-10-12 (×2): qty 1

## 2015-10-12 MED ORDER — NITROGLYCERIN 0.4 MG SL SUBL: 0.4000 mg | SUBLINGUAL_TABLET | SUBLINGUAL | Status: DC | PRN

## 2015-10-12 MED ORDER — ONDANSETRON HCL 4 MG/2ML IJ SOLN: 4.0000 mg | Freq: Four times a day (QID) | INTRAMUSCULAR | Status: DC | PRN

## 2015-10-12 MED ORDER — METOPROLOL TARTRATE 12.5 MG HALF TABLET
12.5000 mg | ORAL_TABLET | Freq: Two times a day (BID) | ORAL | Status: DC
  Administered 2015-10-12 – 2015-10-14 (×6): 12.5 mg via ORAL
  Filled 2015-10-12 (×6): qty 1

## 2015-10-12 MED ORDER — ASPIRIN EC 81 MG PO TBEC
81.0000 mg | DELAYED_RELEASE_TABLET | Freq: Every day | ORAL | Status: DC
  Administered 2015-10-12 – 2015-10-14 (×3): 81 mg via ORAL
  Filled 2015-10-12 (×3): qty 1

## 2015-10-12 MED ORDER — ACETAMINOPHEN 325 MG PO TABS: 650.0000 mg | ORAL_TABLET | ORAL | Status: DC | PRN

## 2015-10-12 MED ORDER — CLOPIDOGREL BISULFATE 300 MG PO TABS
300.0000 mg | ORAL_TABLET | Freq: Once | ORAL | Status: AC
  Administered 2015-10-12: 300 mg via ORAL
  Filled 2015-10-12: qty 1

## 2015-10-12 MED ORDER — HEPARIN (PORCINE) IN NACL 100-0.45 UNIT/ML-% IJ SOLN
1200.0000 [IU]/h | INTRAMUSCULAR | Status: DC
  Administered 2015-10-12: 1050 [IU]/h via INTRAVENOUS
  Filled 2015-10-12: qty 250

## 2015-10-12 NOTE — ED Notes (Signed)
Attempted to call report x 1  

## 2015-10-12 NOTE — H&P (Signed)
CC: CP   HPI: 69 yo AA man who is a smoker presents to ER with midsternal chest discomfort. Symptoms started two days, not related to activity, felt like pressure and heart burn. Also had diaphoresis and dizziness. No fever, chills, bleeding, syncope. No known CAD, MI, CVA, DM or family history of premature CAD. In ER, has been given ASA 324, Plavix 300, Heparin and sl NTG. At this time CP free.    Review of Systems:  10 systems reviewed unremarkable except as noted in HPI    History reviewed. No pertinent past medical history.    Allergies  Allergen Reactions  . Asa [Aspirin]     Trigger stomach issues     Social History   Social History  . Marital Status: Single    Spouse Name: N/A  . Number of Children: N/A  . Years of Education: N/A   Occupational History  . Not on file.   Social History Main Topics  . Smoking status: Current Every Day Smoker -- 0.50 packs/day    Types: Cigarettes  . Smokeless tobacco: Not on file  . Alcohol Use: Yes  . Drug Use: Yes    Special: Marijuana  . Sexual Activity: Not on file   Other Topics Concern  . Not on file   Social History Narrative    Family History  Problem Relation Age of Onset  . Heart attack Father     1980   No current facility-administered medications on file prior to encounter.   Current Outpatient Prescriptions on File Prior to Encounter  Medication Sig Dispense Refill  . allopurinol (ZYLOPRIM) 100 MG tablet Take 1 tablet (100 mg total) by mouth daily. 30 tablet 6  . atorvastatin (LIPITOR) 10 MG tablet Take 1 tablet (10 mg total) by mouth daily. 30 tablet 3     PHYSICAL EXAM: Filed Vitals:   10/11/15 2053  BP:   Pulse: 72  Temp:   Resp:    General:  Well appearing. No respiratory difficulty HEENT: normal Neck: supple. no JVD. Carotids 2+ bilat; no bruits. No lymphadenopathy or thryomegaly appreciated. Cor: PMI nondisplaced. Regular rate & rhythm. No rubs, gallops or murmurs. Lungs:  clear Abdomen: soft, nontender, nondistended. No hepatosplenomegaly. No bruits or masses. Good bowel sounds. Extremities: no cyanosis, clubbing, rash, edema Neuro: alert & oriented x 3, cranial nerves grossly intact. moves all 4 extremities w/o difficulty. Affect pleasant.   Results for orders placed or performed during the hospital encounter of 10/11/15 (from the past 24 hour(s))  Basic metabolic panel     Status: Abnormal   Collection Time: 10/11/15  8:16 PM  Result Value Ref Range   Sodium 140 135 - 145 mmol/L   Potassium 3.9 3.5 - 5.1 mmol/L   Chloride 104 101 - 111 mmol/L   CO2 26 22 - 32 mmol/L   Glucose, Bld 122 (H) 65 - 99 mg/dL   BUN 10 6 - 20 mg/dL   Creatinine, Ser 1.25 (H) 0.61 - 1.24 mg/dL   Calcium 9.2 8.9 - 10.3 mg/dL   GFR calc non Af Amer 57 (L) >60 mL/min   GFR calc Af Amer >60 >60 mL/min   Anion gap 10 5 - 15  CBC     Status: Abnormal   Collection Time: 10/11/15  8:16 PM  Result Value Ref Range   WBC 11.7 (H) 4.0 - 10.5 K/uL   RBC 4.93 4.22 - 5.81 MIL/uL   Hemoglobin 13.5 13.0 - 17.0 g/dL  HCT 41.7 39.0 - 52.0 %   MCV 84.6 78.0 - 100.0 fL   MCH 27.4 26.0 - 34.0 pg   MCHC 32.4 30.0 - 36.0 g/dL   RDW 14.0 11.5 - 15.5 %   Platelets 210 150 - 400 K/uL  Protime-INR     Status: None   Collection Time: 10/11/15  8:16 PM  Result Value Ref Range   Prothrombin Time 13.7 11.6 - 15.2 seconds   INR 1.03 0.00 - 1.49  I-stat troponin, ED (not at [facility], [facility])     Status: Abnormal   Collection Time: 10/11/15  8:23 PM  Result Value Ref Range   Troponin i, poc 0.89 (HH) 0.00 - 0.08 ng/mL   Comment NOTIFIED PHYSICIAN    Comment 3           Dg Chest 2 View  10/11/2015  CLINICAL DATA:  Acute onset of substernal chest pain and weakness. Initial encounter. EXAM: CHEST  2 VIEW COMPARISON:  Chest radiograph from 06/22/2006 FINDINGS: The lungs are well-aerated. Mild vascular congestion is noted. There is no evidence of focal opacification, pleural effusion or pneumothorax. The  heart is normal in size; the mediastinal contour is within normal limits. No acute osseous abnormalities are seen. Scattered clips are seen at the left upper quadrant. IMPRESSION: Mild vascular congestion noted.  Lungs remain grossly clear. Electronically Signed   By: Garald Balding M.D.   On: 10/11/2015 20:45     ASSESSMENT:  1. NSTEMI - presentation is concerning and initial Trop is abnormal 0.89 - Stable hemodynamically, no acute HF or shock  2. Smoker   PLAN/DISCUSSION:  Admit to cardiology  ASA, Plavix, Heparin, Metoprolol, statin Advised to quit smoking  Plan for cath on Monday  Echo   Wandra Mannan, MD

## 2015-10-12 NOTE — Progress Notes (Signed)
    Subjective:  This AM, he denies any complaints including chest discomfort or dyspnea. When asked why he is on Plavix, he reports not being on any medications at home, and he denies prior history of CVA or MI.   Objective:  Vital Signs in the last 24 hours: Temp:  [97.9 F (36.6 C)-98.8 F (37.1 C)] 97.9 F (36.6 C) (11/13 0740) Pulse Rate:  [65-92] 69 (11/13 0740) Resp:  [14-25] 15 (11/13 0740) BP: (90-123)/(53-83) 97/61 mmHg (11/13 0740) SpO2:  [95 %-100 %] 99 % (11/13 0740) Weight:  [160 lb 11.5 oz (72.9 kg)-170 lb (77.111 kg)] 160 lb 11.5 oz (72.9 kg) (11/13 0200)  Intake/Output from previous day: 11/12 0701 - 11/13 0700 In: 364.1 [P.O.:300; I.V.:64.1] Out: 575 [Urine:575]  Physical Exam: General: elderly African American male, resting in bed, NAD HEENT: PERRL, EOMI, no scleral icterus, JVD noted Cardiac: RRR, no rubs, murmurs or gallops Pulm: clear to auscultation bilaterally, no wheezes, rales, or rhonchi Abd: soft, nontender, nondistended, BS present Ext: warm and well perfused, no pedal edema Neuro: responds to questions appropriately; moving all extremities freely    Lab Results:  Recent Labs  10/11/15 2016  WBC 11.7*  HGB 13.5  PLT 210    Recent Labs  10/11/15 2016  NA 140  K 3.9  CL 104  CO2 26  GLUCOSE 122*  BUN 10  CREATININE 1.25*   No results for input(s): TROPONINI in the last 72 hours.  Invalid input(s): CK, MB  Cardiac Studies: Echo pending this AM. EKG pending this AM.  Tele: Occasional PVCs.   Assessment/Plan:  Mr. Comunale is a 69 year old male with ongoing tobacco use who presented with mid-sternal chest discomfort.   Mid-sternal chest discomfort: Initial troponin this AM 6.49 and on heparin gtt for possible NSTEMI. Elevated BNP 230.1 suggestive of underlying CHF though echo pending this AM. No symptoms this AM.  -Continue ASA '81mg'$ , Plavix  -Continue Lopressor 12.'5mg'$  twice daily -Follow-up troponins x 2 -Advised smoking  cessation  Hyperlipidemia: Total cholesterol 211, LDL 114, HDL 72. -Continue Lipitor '40mg'$  daily   Charlott Rakes, M.D. 10/12/2015, 9:14 AM

## 2015-10-12 NOTE — Progress Notes (Signed)
CRITICAL VALUE ALERT  Critical value received:  Troponin 6.48  Date of notification:  10/12/2015  Time of notification:  0945  Critical value read back:Yes.    Nurse who received alert:  Marta Lamas RN  MD notified (1st page):  Dr. Debara Pickett  Time of first page:  0945  MD notified (2nd page):  Time of second page:  Responding MD:  Dr. Debara Pickett  Time MD responded:  [phone]

## 2015-10-12 NOTE — Progress Notes (Signed)
ANTICOAGULATION CONSULT NOTE - Follow Up Consult  Pharmacy Consult for heparin Indication: NSTEMI  Allergies  Allergen Reactions  . Asa [Aspirin] Other (See Comments)    Trigger stomach issues     Patient Measurements: Height: '5\' 11"'$  (180.3 cm) Weight: 160 lb 11.5 oz (72.9 kg) IBW/kg (Calculated) : 75.3  Vital Signs: Temp: 97.9 F (36.6 C) (11/13 1931) Temp Source: Oral (11/13 1931) BP: 107/74 mmHg (11/13 1931) Pulse Rate: 67 (11/13 1931)  Labs:  Recent Labs  10/11/15 2016 10/12/15 0814 10/12/15 1011 10/12/15 1500 10/12/15 1925  HGB 13.5  --  12.9*  --   --   HCT 41.7  --  41.1  --   --   PLT 210  --  215  --   --   LABPROT 13.7  --   --   --   --   INR 1.03  --   --   --   --   HEPARINUNFRC  --  0.21*  --   --  0.24*  CREATININE 1.25*  --   --   --   --   TROPONINI  --  6.48*  --  5.13*  --     Estimated Creatinine Clearance: 56.7 mL/min (by C-G formula based on Cr of 1.25).   Medications:  Infusions:  . heparin 1,050 Units/hr (10/12/15 1724)    Assessment: 69 y/o male on IV heparin for NSTEMI. Hpearin level is subtherapeutic at 0.24 on 1050 units/hr. No bleeding noted. Possible cath tomorrow.  Goal of Therapy:  Heparin level 0.3-0.7 units/ml Monitor platelets by anticoagulation protocol: Yes   Plan:  - Increase heparin drip to 1200 units/hr - Daily heparin level and CBC - Monitor for s/sx of bleeding  [facility], [place].D., BCPS Clinical Pharmacist Pager: [phone] 10/12/2015 8:45 PM

## 2015-10-13 ENCOUNTER — Encounter (HOSPITAL_COMMUNITY)
Admission: EM | Disposition: A | Discharge status: Home / Self Care | Payer: Self-pay | Source: Home / Self Care | Attending: Cardiology

## 2015-10-13 ENCOUNTER — Encounter (HOSPITAL_COMMUNITY): Payer: Self-pay | Admitting: Cardiology

## 2015-10-13 ENCOUNTER — Ambulatory Visit (HOSPITAL_COMMUNITY): Payer: Medicare PPO

## 2015-10-13 DIAGNOSIS — I2510 Atherosclerotic heart disease of native coronary artery without angina pectoris: Secondary | ICD-10-CM

## 2015-10-13 DIAGNOSIS — E785 Hyperlipidemia, unspecified: Secondary | ICD-10-CM

## 2015-10-13 DIAGNOSIS — I10 Essential (primary) hypertension: Secondary | ICD-10-CM | POA: Insufficient documentation

## 2015-10-13 DIAGNOSIS — R079 Chest pain, unspecified: Secondary | ICD-10-CM

## 2015-10-13 HISTORY — PX: CARDIAC CATHETERIZATION: SHX172

## 2015-10-13 LAB — BASIC METABOLIC PANEL
Anion gap: 9 (ref 5–15)
BUN: 9 mg/dL (ref 6–20)
CALCIUM: 8.9 mg/dL (ref 8.9–10.3)
CO2: 27 mmol/L (ref 22–32)
CREATININE: 1.42 mg/dL — AB (ref 0.61–1.24)
Chloride: 102 mmol/L (ref 101–111)
GFR calc Af Amer: 56 mL/min — ABNORMAL LOW (ref >60)
GFR, EST NON AFRICAN AMERICAN: 49 mL/min — AB (ref >60)
GLUCOSE: 143 mg/dL — AB (ref 65–99)
POTASSIUM: 3.7 mmol/L (ref 3.5–5.1)
SODIUM: 138 mmol/L (ref 135–145)

## 2015-10-13 LAB — CBC
HCT: 43.3 % (ref 39.0–52.0)
Hemoglobin: 13.5 g/dL (ref 13.0–17.0)
MCH: 26.7 pg (ref 26.0–34.0)
MCHC: 31.2 g/dL (ref 30.0–36.0)
MCV: 85.6 fL (ref 78.0–100.0)
PLATELETS: 223 10*3/uL (ref 150–400)
RBC: 5.06 MIL/uL (ref 4.22–5.81)
RDW: 14.3 % (ref 11.5–15.5)
WBC: 10 10*3/uL (ref 4.0–10.5)

## 2015-10-13 LAB — HEMOGLOBIN A1C
HEMOGLOBIN A1C: 6.2 % — AB (ref 4.8–5.6)
MEAN PLASMA GLUCOSE: 131 mg/dL

## 2015-10-13 LAB — PROTIME-INR
INR: 1.05 (ref 0.00–1.49)
PROTHROMBIN TIME: 13.9 s (ref 11.6–15.2)

## 2015-10-13 LAB — POCT ACTIVATED CLOTTING TIME: Activated Clotting Time: 528 seconds

## 2015-10-13 LAB — HEPARIN LEVEL (UNFRACTIONATED): Heparin Unfractionated: 0.35 IU/mL (ref 0.30–0.70)

## 2015-10-13 SURGERY — LEFT HEART CATH AND CORONARY ANGIOGRAPHY
Anesthesia: LOCAL

## 2015-10-13 MED ORDER — SODIUM CHLORIDE 0.9 % WEIGHT BASED INFUSION
3.0000 mL/kg/h | INTRAVENOUS | Status: DC
  Administered 2015-10-13: 3 mL/kg/h via INTRAVENOUS

## 2015-10-13 MED ORDER — LIDOCAINE HCL (PF) 1 % IJ SOLN
INTRAMUSCULAR | Status: AC
  Filled 2015-10-13: qty 30

## 2015-10-13 MED ORDER — SODIUM CHLORIDE 0.9 % IJ SOLN: 3.0000 mL | INTRAMUSCULAR | Status: DC | PRN

## 2015-10-13 MED ORDER — SODIUM CHLORIDE 0.9 % WEIGHT BASED INFUSION
1.0000 mL/kg/h | INTRAVENOUS | Status: DC
  Administered 2015-10-13: 1 mL/kg/h via INTRAVENOUS

## 2015-10-13 MED ORDER — HEPARIN SODIUM (PORCINE) 1000 UNIT/ML IJ SOLN
INTRAMUSCULAR | Status: DC | PRN
  Administered 2015-10-13: 4000 [IU] via INTRAVENOUS

## 2015-10-13 MED ORDER — SODIUM CHLORIDE 0.9 % IJ SOLN
3.0000 mL | Freq: Two times a day (BID) | INTRAMUSCULAR | Status: DC
  Administered 2015-10-13 – 2015-10-14 (×2): 10 mL via INTRAVENOUS

## 2015-10-13 MED ORDER — FENTANYL CITRATE (PF) 100 MCG/2ML IJ SOLN
INTRAMUSCULAR | Status: DC | PRN
  Administered 2015-10-13: 25 ug via INTRAVENOUS

## 2015-10-13 MED ORDER — BIVALIRUDIN BOLUS VIA INFUSION - CUPID
INTRAVENOUS | Status: DC | PRN
  Administered 2015-10-13: 54.675 mg via INTRAVENOUS

## 2015-10-13 MED ORDER — LIDOCAINE HCL (PF) 1 % IJ SOLN
INTRAMUSCULAR | Status: DC | PRN
  Administered 2015-10-13: 13:00:00

## 2015-10-13 MED ORDER — SODIUM CHLORIDE 0.9 % WEIGHT BASED INFUSION
3.0000 mL/kg/h | INTRAVENOUS | Status: AC
Start: 2015-10-13 — End: 2015-10-13
  Administered 2015-10-13: 3 mL/kg/h via INTRAVENOUS

## 2015-10-13 MED ORDER — HEPARIN (PORCINE) IN NACL 2-0.9 UNIT/ML-% IJ SOLN
INTRAMUSCULAR | Status: AC
  Filled 2015-10-13: qty 1000

## 2015-10-13 MED ORDER — SODIUM CHLORIDE 0.9 % IV SOLN
250.0000 mg | INTRAVENOUS | Status: DC | PRN
  Administered 2015-10-13: 1.75 mg/kg/h via INTRAVENOUS

## 2015-10-13 MED ORDER — VERAPAMIL HCL 2.5 MG/ML IV SOLN
INTRAVENOUS | Status: AC
  Filled 2015-10-13: qty 2

## 2015-10-13 MED ORDER — NITROGLYCERIN 1 MG/10 ML FOR IR/CATH LAB
INTRA_ARTERIAL | Status: DC | PRN
  Administered 2015-10-13: 200 ug via INTRACORONARY

## 2015-10-13 MED ORDER — SODIUM CHLORIDE 0.9 % IV SOLN: 250.0000 mL | INTRAVENOUS | Status: DC | PRN

## 2015-10-13 MED ORDER — MIDAZOLAM HCL 2 MG/2ML IJ SOLN
INTRAMUSCULAR | Status: AC
  Filled 2015-10-13: qty 4

## 2015-10-13 MED ORDER — FENTANYL CITRATE (PF) 100 MCG/2ML IJ SOLN
INTRAMUSCULAR | Status: AC
  Filled 2015-10-13: qty 4

## 2015-10-13 MED ORDER — HEPARIN SODIUM (PORCINE) 1000 UNIT/ML IJ SOLN
INTRAMUSCULAR | Status: AC
  Filled 2015-10-13: qty 1

## 2015-10-13 MED ORDER — SODIUM CHLORIDE 0.9 % IJ SOLN: 3.0000 mL | Freq: Two times a day (BID) | INTRAMUSCULAR | Status: DC

## 2015-10-13 MED ORDER — MIDAZOLAM HCL 2 MG/2ML IJ SOLN
INTRAMUSCULAR | Status: DC | PRN
  Administered 2015-10-13: 1 mg via INTRAVENOUS

## 2015-10-13 MED ORDER — BIVALIRUDIN 250 MG IV SOLR
INTRAVENOUS | Status: AC
  Filled 2015-10-13: qty 250

## 2015-10-13 MED ORDER — IOHEXOL 350 MG/ML SOLN
INTRAVENOUS | Status: DC | PRN
  Administered 2015-10-13: 125 mL via INTRA_ARTERIAL

## 2015-10-13 MED ORDER — HEPARIN (PORCINE) IN NACL 2-0.9 UNIT/ML-% IJ SOLN
INTRAMUSCULAR | Status: DC | PRN
  Administered 2015-10-13: 12:00:00 via INTRA_ARTERIAL

## 2015-10-13 SURGICAL SUPPLY — 20 items
BALLN EMERGE MR 2.5X12 (BALLOONS) ×3
BALLN ~~LOC~~ EMERGE MR 3.75X8 (BALLOONS) ×3
BALLOON EMERGE MR 2.5X12 (BALLOONS) IMPLANT
BALLOON ~~LOC~~ EMERGE MR 3.75X8 (BALLOONS) IMPLANT
CATH INFINITI 5 FR JL3.5 (CATHETERS) ×3 IMPLANT
CATH INFINITI 5FR ANG PIGTAIL (CATHETERS) ×3 IMPLANT
CATH INFINITI JR4 5F (CATHETERS) ×3 IMPLANT
DEVICE RAD COMP TR BAND LRG (VASCULAR PRODUCTS) ×3 IMPLANT
GLIDESHEATH SLEND SS 6F .021 (SHEATH) ×3 IMPLANT
GUIDE CATH RUNWAY 6FR CLS3.5 (CATHETERS) ×1 IMPLANT
KIT ENCORE 26 ADVANTAGE (KITS) ×1 IMPLANT
KIT HEART LEFT (KITS) ×3 IMPLANT
PACK CARDIAC CATHETERIZATION (CUSTOM PROCEDURE TRAY) ×3 IMPLANT
STENT PROMUS PREM MR 3.5X12 (Permanent Stent) ×1 IMPLANT
SYR MEDRAD MARK V 150ML (SYRINGE) ×3 IMPLANT
TRANSDUCER W/STOPCOCK (MISCELLANEOUS) ×3 IMPLANT
TUBING CIL FLEX 10 FLL-RA (TUBING) ×3 IMPLANT
WIRE ASAHI PROWATER 180CM (WIRE) ×1 IMPLANT
WIRE HI TORQ VERSACORE-J 145CM (WIRE) ×1 IMPLANT
WIRE SAFE-T 1.5MM-J .035X260CM (WIRE) ×3 IMPLANT

## 2015-10-13 NOTE — Progress Notes (Signed)
Patient Name: Frank Hart Date of Encounter: 10/13/2015  Active Problems:   NSTEMI (non-ST elevated myocardial infarction) ([place])   Length of Stay: 1  SUBJECTIVE  The patient seems to be comfortable ,no more chest pain or SOB. Awaiting cardiac cath today.   CURRENT MEDS . aspirin EC  81 mg Oral Daily  . atorvastatin  40 mg Oral q1800  . clopidogrel  75 mg Oral Q breakfast  . metoprolol tartrate  12.5 mg Oral BID  . sodium chloride  3 mL Intravenous Q12H   . sodium chloride 1 mL/kg/hr (10/13/15 0745)  . heparin 1,200 Units/hr (10/13/15 0800)   OBJECTIVE  Filed Vitals:   10/12/15 2319 10/13/15 0304 10/13/15 0400 10/13/15 0728  BP: 113/69 111/76  111/64  Pulse: 66 63 71 69  Temp: 98.9 F (37.2 C) 98.8 F (37.1 C)  97.9 F (36.6 C)  TempSrc: Oral Oral  Oral  Resp: '17 16 20 27  '$ Height:      Weight:      SpO2: 98% 96% 97% 100%    Intake/Output Summary (Last 24 hours) at 10/13/15 0833 Last data filed at 10/13/15 0800  Gross per 24 hour  Intake 856.23 ml  Output   1875 ml  Net -1018.77 ml   Filed Weights   10/11/15 2014 10/12/15 0200  Weight: 170 lb (77.111 kg) 160 lb 11.5 oz (72.9 kg)   PHYSICAL EXAM  General: Pleasant, NAD. Neuro: Alert and oriented X 3. Moves all extremities spontaneously. Psych: Normal affect. HEENT:  Normal  Neck: Supple without bruits or JVD. Lungs:  Resp regular and unlabored, CTA. Heart: RRR no s3, s4, or murmurs. Abdomen: Soft, non-tender, non-distended, BS + x 4.  Extremities: No clubbing, cyanosis or edema. DP/PT/Radials 2+ and equal bilaterally.  Accessory Clinical Findings  CBC  Recent Labs  10/12/15 1011 10/13/15 0422  WBC 10.7* 10.0  HGB 12.9* 13.5  HCT 41.1 43.3  MCV 85.8 85.6  PLT 215 798   Basic Metabolic Panel  Recent Labs  10/11/15 2016 10/13/15 0623  NA 140 138  K 3.9 3.7  CL 104 102  CO2 26 27  GLUCOSE 122* 143*  BUN 10 9  CREATININE 1.25* 1.42*  CALCIUM 9.2 8.9   Cardiac  Enzymes  Recent Labs  10/12/15 0814 10/12/15 1500  TROPONINI 6.48* 5.13*    Recent Labs  10/12/15 0352  CHOL 211*  HDL 72  LDLCALC 114*  TRIG 126  CHOLHDL 2.9   Radiology/Studies  Dg Chest 2 View  10/11/2015  CLINICAL DATA:  Acute onset of substernal chest pain and weakness. Initial encounter. EXAM: CHEST  2 VIEW COMPARISON:  Chest radiograph from 06/22/2006 FINDINGS: The lungs are well-aerated. Mild vascular congestion is noted. There is no evidence of focal opacification, pleural effusion or pneumothorax. The heart is normal in size; the mediastinal contour is within normal limits. No acute osseous abnormalities are seen. Scattered clips are seen at the left upper quadrant. IMPRESSION: Mild vascular congestion noted.  Lungs remain grossly clear. Electronically Signed   By: Garald Balding M.D.   On: 10/11/2015 20:45   TELE: Sinus bradycardia, one ventricular couplet  ECG: SB, lateral ischemia    ASSESSMENT AND PLAN  69 yo AA man who is a smoker presents to ER with midsternal chest discomfort. Symptoms started two days, not related to activity, felt like pressure and heart burn. Also had diaphoresis and dizziness. No fever, chills, bleeding, syncope. No known CAD, MI, CVA, DM or family history  of premature CAD. In ER, has been given ASA 324, Plavix 300, Heparin and sl NTG. At this time CP free.   1. NSTEMI - presentation is concerning and initial Trop is abnormal 0.89 -->6.4 --> 5.1, ECG shows significant changes in the lateral leads - Stable hemodynamically, chest pain free - on iv heparin - on ASA, Plavix, atorvastatin, low dose metoprolol as he is bradycardic - left cardiac cath today  2. Smoker - discussed  3. HTN - controlled  Signed, Dorothy Spark MD, [facility] 10/13/2015

## 2015-10-13 NOTE — Care Management Note (Signed)
Case Management Note  Patient Details  Name: Frank Hart MRN: [id number] Date of Birth: [birth year]  Subjective/Objective:   Adm w nstemi                 Action/Plan: lives w fam, pcp dr m pandey   Expected Discharge Date:                  Expected Discharge Plan:     In-House Referral:     Discharge planning Services     Post Acute Care Choice:    Choice offered to:     DME Arranged:    DME Agency:     HH Arranged:    [place] Agency:     Status of Service:     Medicare Important Message Given:    Date Medicare IM Given:    Medicare IM give by:    Date Additional Medicare IM Given:    Additional Medicare Important Message give by:     If discussed at [place] of Stay Meetings, dates discussed:    Additional Comments: ur review done  Lacretia Leigh, RN 10/13/2015, 8:56 AM

## 2015-10-13 NOTE — H&P (View-Only) (Signed)
Patient Name: Frank Hart Date of Encounter: 10/13/2015  Active Problems:   NSTEMI (non-ST elevated myocardial infarction) ([place])   Length of Stay: 1  SUBJECTIVE  The patient seems to be comfortable ,no more chest pain or SOB. Awaiting cardiac cath today.   CURRENT MEDS . aspirin EC  81 mg Oral Daily  . atorvastatin  40 mg Oral q1800  . clopidogrel  75 mg Oral Q breakfast  . metoprolol tartrate  12.5 mg Oral BID  . sodium chloride  3 mL Intravenous Q12H   . sodium chloride 1 mL/kg/hr (10/13/15 0745)  . heparin 1,200 Units/hr (10/13/15 0800)   OBJECTIVE  Filed Vitals:   10/12/15 2319 10/13/15 0304 10/13/15 0400 10/13/15 0728  BP: 113/69 111/76  111/64  Pulse: 66 63 71 69  Temp: 98.9 F (37.2 C) 98.8 F (37.1 C)  97.9 F (36.6 C)  TempSrc: Oral Oral  Oral  Resp: '17 16 20 27  '$ Height:      Weight:      SpO2: 98% 96% 97% 100%    Intake/Output Summary (Last 24 hours) at 10/13/15 0833 Last data filed at 10/13/15 0800  Gross per 24 hour  Intake 856.23 ml  Output   1875 ml  Net -1018.77 ml   Filed Weights   10/11/15 2014 10/12/15 0200  Weight: 170 lb (77.111 kg) 160 lb 11.5 oz (72.9 kg)   PHYSICAL EXAM  General: Pleasant, NAD. Neuro: Alert and oriented X 3. Moves all extremities spontaneously. Psych: Normal affect. HEENT:  Normal  Neck: Supple without bruits or JVD. Lungs:  Resp regular and unlabored, CTA. Heart: RRR no s3, s4, or murmurs. Abdomen: Soft, non-tender, non-distended, BS + x 4.  Extremities: No clubbing, cyanosis or edema. DP/PT/Radials 2+ and equal bilaterally.  Accessory Clinical Findings  CBC  Recent Labs  10/12/15 1011 10/13/15 0422  WBC 10.7* 10.0  HGB 12.9* 13.5  HCT 41.1 43.3  MCV 85.8 85.6  PLT 215 381   Basic Metabolic Panel  Recent Labs  10/11/15 2016 10/13/15 0623  NA 140 138  K 3.9 3.7  CL 104 102  CO2 26 27  GLUCOSE 122* 143*  BUN 10 9  CREATININE 1.25* 1.42*  CALCIUM 9.2 8.9   Cardiac  Enzymes  Recent Labs  10/12/15 0814 10/12/15 1500  TROPONINI 6.48* 5.13*    Recent Labs  10/12/15 0352  CHOL 211*  HDL 72  LDLCALC 114*  TRIG 126  CHOLHDL 2.9   Radiology/Studies  Dg Chest 2 View  10/11/2015  CLINICAL DATA:  Acute onset of substernal chest pain and weakness. Initial encounter. EXAM: CHEST  2 VIEW COMPARISON:  Chest radiograph from 06/22/2006 FINDINGS: The lungs are well-aerated. Mild vascular congestion is noted. There is no evidence of focal opacification, pleural effusion or pneumothorax. The heart is normal in size; the mediastinal contour is within normal limits. No acute osseous abnormalities are seen. Scattered clips are seen at the left upper quadrant. IMPRESSION: Mild vascular congestion noted.  Lungs remain grossly clear. Electronically Signed   By: Garald Balding M.D.   On: 10/11/2015 20:45   TELE: Sinus bradycardia, one ventricular couplet  ECG: SB, lateral ischemia    ASSESSMENT AND PLAN  69 yo AA man who is a smoker presents to ER with midsternal chest discomfort. Symptoms started two days, not related to activity, felt like pressure and heart burn. Also had diaphoresis and dizziness. No fever, chills, bleeding, syncope. No known CAD, MI, CVA, DM or family history  of premature CAD. In ER, has been given ASA 324, Plavix 300, Heparin and sl NTG. At this time CP free.   1. NSTEMI - presentation is concerning and initial Trop is abnormal 0.89 -->6.4 --> 5.1, ECG shows significant changes in the lateral leads - Stable hemodynamically, chest pain free - on iv heparin - on ASA, Plavix, atorvastatin, low dose metoprolol as he is bradycardic - left cardiac cath today  2. Smoker - discussed  3. HTN - controlled  Signed, Dorothy Spark MD, [facility] 10/13/2015

## 2015-10-13 NOTE — Progress Notes (Signed)
Echocardiogram 2D Echocardiogram has been performed.  Frank Hart 10/13/2015, 11:23 AM

## 2015-10-13 NOTE — Progress Notes (Signed)
ANTICOAGULATION CONSULT NOTE - Follow Up Consult  Pharmacy Consult for Heparin  Indication: chest pain/ACS  Allergies  Allergen Reactions  . Asa [Aspirin] Other (See Comments)    Trigger stomach issues     Patient Measurements: Height: '5\' 11"'$  (180.3 cm) Weight: 160 lb 11.5 oz (72.9 kg) IBW/kg (Calculated) : 75.3 Vital Signs: Temp: 98.8 F (37.1 C) (11/14 0304) Temp Source: Oral (11/14 0304) BP: 111/76 mmHg (11/14 0304) Pulse Rate: 71 (11/14 0400)  Labs:  Recent Labs  10/11/15 2016 10/12/15 0814 10/12/15 1011 10/12/15 1500 10/12/15 1925 10/13/15 0422  HGB 13.5  --  12.9*  --   --  13.5  HCT 41.7  --  41.1  --   --  43.3  PLT 210  --  215  --   --  223  LABPROT 13.7  --   --   --   --   --   INR 1.03  --   --   --   --   --   HEPARINUNFRC  --  0.21*  --   --  0.24* 0.35  CREATININE 1.25*  --   --   --   --   --   TROPONINI  --  6.48*  --  5.13*  --   --     Estimated Creatinine Clearance: 56.7 mL/min (by C-G formula based on Cr of 1.25).   Assessment: Therapeutic heparin level after rate increase, possible cath today  Goal of Therapy:  Heparin level 0.3-0.7 units/ml Monitor platelets by anticoagulation protocol: Yes   Plan:  -Continue heparin at 1200 units/hr -1200 confirmatory HL, pending cath  Narda Bonds 10/13/2015,6:07 AM

## 2015-10-13 NOTE — Interval H&P Note (Signed)
History and Physical Interval Note:  10/13/2015 11:37 AM  Frank Hart  has presented today for surgery, with the diagnosis of NSTEMI  The various methods of treatment have been discussed with the patient and family. After consideration of risks, benefits and other options for treatment, the patient has consented to  Procedure(s): Left Heart Cath and Coronary Angiography (N/A) as a surgical intervention .  The patient's history has been reviewed, patient examined, no change in status, stable for surgery.  I have reviewed the patient's chart and labs.  Questions were answered to the patient's satisfaction.   Cath Lab Visit (complete for each Cath Lab visit)  Clinical Evaluation Leading to the Procedure:   ACS: Yes.    Non-ACS:    Anginal Classification: CCS III  Anti-ischemic medical therapy: Minimal Therapy (1 class of medications)  Non-Invasive Test Results: No non-invasive testing performed  Prior CABG: No previous CABG        Collier [facility] 10/13/2015 11:37 AM

## 2015-10-14 ENCOUNTER — Encounter (HOSPITAL_COMMUNITY): Payer: Self-pay | Admitting: Physician Assistant

## 2015-10-14 ENCOUNTER — Telehealth: Payer: Self-pay | Admitting: Internal Medicine

## 2015-10-14 DIAGNOSIS — I255 Ischemic cardiomyopathy: Secondary | ICD-10-CM

## 2015-10-14 DIAGNOSIS — I2510 Atherosclerotic heart disease of native coronary artery without angina pectoris: Secondary | ICD-10-CM

## 2015-10-14 DIAGNOSIS — Z720 Tobacco use: Secondary | ICD-10-CM

## 2015-10-14 HISTORY — DX: Tobacco use: Z72.0

## 2015-10-14 LAB — BASIC METABOLIC PANEL
Anion gap: 11 (ref 5–15)
BUN: 10 mg/dL (ref 6–20)
CHLORIDE: 104 mmol/L (ref 101–111)
CO2: 23 mmol/L (ref 22–32)
Calcium: 8.7 mg/dL — ABNORMAL LOW (ref 8.9–10.3)
Creatinine, Ser: 1.27 mg/dL — ABNORMAL HIGH (ref 0.61–1.24)
GFR, EST NON AFRICAN AMERICAN: 56 mL/min — AB (ref >60)
Glucose, Bld: 131 mg/dL — ABNORMAL HIGH (ref 65–99)
POTASSIUM: 3.8 mmol/L (ref 3.5–5.1)
SODIUM: 138 mmol/L (ref 135–145)

## 2015-10-14 LAB — CBC
HEMATOCRIT: 41.3 % (ref 39.0–52.0)
Hemoglobin: 13.2 g/dL (ref 13.0–17.0)
MCH: 27.2 pg (ref 26.0–34.0)
MCHC: 32 g/dL (ref 30.0–36.0)
MCV: 85 fL (ref 78.0–100.0)
PLATELETS: 210 10*3/uL (ref 150–400)
RBC: 4.86 MIL/uL (ref 4.22–5.81)
RDW: 14 % (ref 11.5–15.5)
WBC: 10.1 10*3/uL (ref 4.0–10.5)

## 2015-10-14 MED ORDER — METOPROLOL TARTRATE 25 MG PO TABS: 12.5000 mg | ORAL_TABLET | Freq: Two times a day (BID) | ORAL | Status: DC

## 2015-10-14 MED ORDER — ASPIRIN 81 MG PO TBEC: 81.0000 mg | DELAYED_RELEASE_TABLET | Freq: Every day | ORAL | Status: DC

## 2015-10-14 MED ORDER — ATORVASTATIN CALCIUM 40 MG PO TABS: 40.0000 mg | ORAL_TABLET | Freq: Every day | ORAL | Status: DC

## 2015-10-14 MED ORDER — NITROGLYCERIN 0.4 MG SL SUBL: 0.4000 mg | SUBLINGUAL_TABLET | SUBLINGUAL | Status: DC | PRN

## 2015-10-14 MED ORDER — CLOPIDOGREL BISULFATE 75 MG PO TABS: 75.0000 mg | ORAL_TABLET | Freq: Every day | ORAL | Status: DC

## 2015-10-14 MED ORDER — PANTOPRAZOLE SODIUM 40 MG PO TBEC: 40.0000 mg | DELAYED_RELEASE_TABLET | Freq: Every day | ORAL | Status: DC

## 2015-10-14 NOTE — Discharge Instructions (Signed)
No driving for 24 hours. No lifting over 5 lbs for 1 week. No sexual activity for 1 week. Keep procedure site clean & dry. If you notice increased pain, swelling, bleeding or pus, call/return!  You may shower, but no soaking baths/hot tubs/pools for 1 week.     Acute Coronary Syndrome Acute coronary syndrome (ACS) is a serious problem in which there is suddenly not enough blood and oxygen supplied to the heart. ACS may mean that one or more of the blood vessels in your heart (coronary arteries) may be blocked. ACS can result in chest pain or a heart attack (myocardial infarction or MI). CAUSES This condition is caused by atherosclerosis, which is the buildup of fat and cholesterol (plaque) on the inside of the arteries. Over time, the plaque may narrow or block the artery, and this will lessen blood flow to the heart. Plaque can also become weak and break off within a coronary artery to form a clot and cause a sudden blockage. RISK FACTORS The risks factors of this condition include:  High cholesterol levels.  High blood pressure (hypertension).  Smoking.  Diabetes.  Age.  Family history of chest pain, heart disease, or stroke.  Lack of exercise. SYMPTOMS The most common signs of this condition include:  Chest pain, which can be:  A crushing or squeezing in the chest.  A tightness, pressure, fullness, or heaviness in the chest.  Present for more than a few minutes, or it can stop and recur.  Pain in the arms, neck, jaw, or back.  Unexplained heartburn or indigestion.  Shortness of breath.  Nausea.  Sudden cold sweats.  Feeling light-headed or dizzy. Sometimes, this condition has no symptoms. DIAGNOSIS ACS may be diagnosed through the following tests:  Electrocardiogram (ECG).  Blood tests.  Coronary angiogram. This is a procedure to look at the coronary arteries to see if there is any blockage. TREATMENT Treatment for ACS may include:  Healthy behavioral  changes to reduce or control risk factors.  Medicine.  Coronary stenting.A stent helps to keep an artery open.  Coronary angioplasty. This procedure widens a narrowed or blocked artery.  Coronary artery bypass surgery. This will allow your blood to pass the blockage (bypass) to reach your heart. HOME CARE INSTRUCTIONS Eating and Drinking  Follow a heart-healthy diet. A dietitian can you help to educate you about healthy food options and changes.  Use healthy cooking methods such as roasting, grilling, broiling, baking, poaching, steaming, or stir-frying. Talk to a dietitian to learn more about healthy cooking methods. Medicines  Take medicines only as directed by your health care provider.  Do not take the following medicines unless your health care provider approves:  Nonsteroidal anti-inflammatory drugs (NSAIDs), such as ibuprofen, naproxen, or celecoxib.  Vitamin supplements that contain vitamin A, vitamin E, or both.  Hormone replacement therapy that contains estrogen with or without progestin.  Stop illegal drug use. Activities  Follow an exercise program that is approved by your health care provider.  Plan rest periods when you are fatigued. Lifestyle  Do not use any tobacco products, including cigarettes, chewing tobacco, or electronic cigarettes. If you need help quitting, ask your health care provider.  If you drink alcohol, and your health care provider approves, limit your alcohol intake to no more than 1 drink per day. One drink equals 12 ounces of beer, 5 ounces of wine, or 1 ounces of hard liquor.  Learn to manage stress.  Maintain a healthy weight. Lose weight as approved  by your health care provider. General Instructions  Manage other health conditions, such as hypertension and diabetes, as directed by your health care provider.  Keep all follow-up visits as directed by your health care provider. This is important.  Your health care provider may ask  you to monitor your blood pressure. A blood pressure reading consists of a higher number over a lower number, such as 110 over 72, written as 110/72. Ideally, your blood pressure should be:  Below 140/90 if you have no other medical conditions.  Below 130/80 if you have diabetes or kidney disease. SEEK IMMEDIATE MEDICAL CARE IF:  You have pain in your chest, neck, arm, jaw, stomach, or back that lasts more than a few minutes, is recurring, or is not relieved by taking medicine under your tongue (sublingual nitroglycerin).  You have profuse sweating without cause.  You have unexplained:  Heartburn or indigestion.  Shortness of breath or difficulty breathing.  Nausea or vomiting.  Fatigue.  Feelings of nervousness or anxiety.  Weakness.  Diarrhea.  You have sudden light-headedness or dizziness.  You faint. These symptoms may represent a serious problem that is an emergency. Do not wait to see if the symptoms will go away. Get medical help right away. Call your local emergency services (911 in the [place] Do not drive yourself to the clinic or hospital.   This information is not intended to replace advice given to you by your health care provider. Make sure you discuss any questions you have with your health care provider.   Document Released: 11/15/2005 Document Revised: 12/06/2014 Document Reviewed: 03/19/2014 Elsevier Interactive Patient Education [employer].

## 2015-10-14 NOTE — Telephone Encounter (Signed)
TCM per [facility]   11/23 @ 3:15 with Dr. Debara Pickett

## 2015-10-14 NOTE — Telephone Encounter (Signed)
Patient contacted regarding discharge from [facility] on 10/14/2015.  Patient understands to follow up with provider Dr. Debara Pickett on 10/22/2015 at 3:15 pm at [facility] location. Patient understands discharge instructions? yes Patient understands medications and regiment? yes Patient understands to bring all medications to this visit? yes

## 2015-10-14 NOTE — Progress Notes (Signed)
CARDIAC REHAB PHASE I   PRE:  Rate/Rhythm: 81 SR  BP:  Sitting: 121/87        SaO2: 98 RA  MODE:  Ambulation: 700 ft   POST:  Rate/Rhythm: 85 SR  BP:  Sitting: 134/75         SaO2: 99 RA  Pt ambulated 700 ft on RA, independent, brisk, steady gait, tolerated well.  Pt denies cp, dizziness, DOE, declined rest stop. Completed MI/stent education with pt's significant other at bedside.  Reviewed risk factors, tobacco cessation (gave pt fake cigarette), MI book, anti-platelet therapy, stent card, activity restrictions, ntg, exercise, heart healthy diet, portion control, CHF booklet and zone tool, s/s heart failure, sodium and fluid restrictions, daily weights and phase 2 cardiac rehab. Pt verbalized understanding, very receptive to education. Pt agrees to phase 2 cardiac rehab referral, will send to [facility]. Pt to bed after walk, call bell within reach, awaiting discharge.  [id number] Lenna Sciara, RN, BSN 10/14/2015 12:23 PM

## 2015-10-14 NOTE — Progress Notes (Signed)
Patient Name: Frank Hart Date of Encounter: 10/14/2015  Active Problems:   NSTEMI (non-ST elevated myocardial infarction) ([place])   Hyperlipidemia   Essential hypertension   Length of Stay: 2  SUBJECTIVE  The patient seems to be comfortable , no more chest pain or SOB, he is ready to go home.   CURRENT MEDS . aspirin EC  81 mg Oral Daily  . atorvastatin  40 mg Oral q1800  . clopidogrel  75 mg Oral Q breakfast  . metoprolol tartrate  12.5 mg Oral BID  . sodium chloride  3 mL Intravenous Q12H     OBJECTIVE  Filed Vitals:   10/13/15 2351 10/14/15 0000 10/14/15 0346 10/14/15 0732  BP: 115/78 115/78 99/69 117/84  Pulse: 75 88 68   Temp: 98.6 F (37 C)  98.9 F (37.2 C) 98.1 F (36.7 C)  TempSrc: Oral  Oral Oral  Resp: '18  18 18  '$ Height:      Weight:   160 lb (72.576 kg)   SpO2: 100% 100% 100% 100%    Intake/Output Summary (Last 24 hours) at 10/14/15 1207 Last data filed at 10/14/15 0900  Gross per 24 hour  Intake    900 ml  Output    650 ml  Net    250 ml   Filed Weights   10/11/15 2014 10/12/15 0200 10/14/15 0346  Weight: 170 lb (77.111 kg) 160 lb 11.5 oz (72.9 kg) 160 lb (72.576 kg)   PHYSICAL EXAM  General: Pleasant, NAD. Neuro: Alert and oriented X 3. Moves all extremities spontaneously. Psych: Normal affect. HEENT:  Normal  Neck: Supple without bruits or JVD. Lungs:  Resp regular and unlabored, CTA. Heart: RRR no s3, s4, or murmurs. Abdomen: Soft, non-tender, non-distended, BS + x 4.  Extremities: No clubbing, cyanosis or edema. DP/PT/Radials 2+ and equal bilaterally.  Accessory Clinical Findings  CBC  Recent Labs  10/13/15 0422 10/14/15 0312  WBC 10.0 10.1  HGB 13.5 13.2  HCT 43.3 41.3  MCV 85.6 85.0  PLT 223 734   Basic Metabolic Panel  Recent Labs  10/13/15 0623 10/14/15 0312  NA 138 138  K 3.7 3.8  CL 102 104  CO2 27 23  GLUCOSE 143* 131*  BUN 9 10  CREATININE 1.42* 1.27*  CALCIUM 8.9 8.7*   Cardiac  Enzymes  Recent Labs  10/12/15 0814 10/12/15 1500  TROPONINI 6.48* 5.13*    Recent Labs  10/12/15 0352  CHOL 211*  HDL 72  LDLCALC 114*  TRIG 126  CHOLHDL 2.9   Radiology/Studies  Dg Chest 2 View  10/11/2015  CLINICAL DATA:  Acute onset of substernal chest pain and weakness. Initial encounter. EXAM: CHEST  2 VIEW COMPARISON:  Chest radiograph from 06/22/2006 FINDINGS: The lungs are well-aerated. Mild vascular congestion is noted. There is no evidence of focal opacification, pleural effusion or pneumothorax. The heart is normal in size; the mediastinal contour is within normal limits. No acute osseous abnormalities are seen. Scattered clips are seen at the left upper quadrant. IMPRESSION: Mild vascular congestion noted.  Lungs remain grossly clear. Electronically Signed   By: Garald Balding M.D.   On: 10/11/2015 20:45   TELE: Sinus bradycardia, one ventricular couplet  ECG: SB, lateral ischemia    ASSESSMENT AND PLAN  69 yo AA man who is a smoker presents to ER with midsternal chest discomfort. Symptoms started two days, not related to activity, felt like pressure and heart burn. Also had diaphoresis and dizziness. No fever,  chills, bleeding, syncope. No known CAD, MI, CVA, DM or family history of premature CAD. In ER, has been given ASA 324, Plavix 300, Heparin and sl NTG. At this time CP free.   1. 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 Post intervention with DES, there is a 0% residual stenosis.  Plan: DAPT for one year. Discharge home today.  - Stable hemodynamically, chest pain free - on ASA, Plavix, atorvastatin, low dose metoprolol as he is bradycardic - no arrhythmias on telemetry - I would hold off ACEI/ARB as he has AKI and hypotension - reevaluate at the next  outpatient visit  2. Smoker - discussed  3. HTN - controlled  Discharge home today, we will arrange for an outpatient follow up.  Signed, Dorothy Spark MD, [facility] 10/14/2015

## 2015-10-14 NOTE — Discharge Summary (Signed)
Discharge Summary   Patient ID: Frank Hart,  MRN: [id number], DOB/AGE: July 16, 1946 69 y.o.  Admit date: 10/11/2015 Discharge date: 10/14/2015  Primary Care Provider: Blanchie Serve Primary Cardiologist:   Discharge Diagnoses Principal Problem:   NSTEMI (non-ST elevated myocardial infarction) [facility]) Active Problems:   Hyperlipidemia   Essential hypertension   CAD (coronary artery disease), native coronary artery   Cardiomyopathy, ischemic EF 40%   Tobacco abuse   Allergies Allergies  Allergen Reactions  . Asa [Aspirin] Other (See Comments)    Trigger stomach issues     Procedures  Echocardiogram 10/13/2015 LV EF: 40% -  45%  ------------------------------------------------------------------- Indications:   Chest pain 786.51.  ------------------------------------------------------------------- History:  PMH:  Myocardial infarction. Risk factors: Dyslipidemia.  ------------------------------------------------------------------- Study Conclusions  - Left ventricle: Global LV longitudinal strain is -14.3% The cavity size was normal. Systolic function was mildly to moderately reduced. The estimated ejection fraction was in the range of 40% to 45%. There is severe hypokinesis of the entireinferolateral and inferior myocardium. There is mild hypokinesis of the entirelateral myocardium. Features are consistent with a pseudonormal left ventricular filling pattern, with concomitant abnormal relaxation and increased filling pressure (grade 2 diastolic dysfunction). - Aortic valve: Trileaflet; mildly thickened, mildly calcified leaflets. - Mitral valve: There was mild regurgitation. - Tricuspid valve: There was mild regurgitation.    Cardiac catheterization 10/13/2015 Conclusion     Prox RCA to Mid RCA lesion, 15% stenosed.  There is mild left ventricular systolic dysfunction.  Prox Cx to Mid Cx lesion, 99% stenosed. Post  intervention, there is a 0% residual stenosis.  1. Single vessel obstructive CAD involving the mid LCx. 2. Mild LV dysfunction 3. Successful stenting of the mid LCx with DES  Plan: DAPT for one year. Anticipate DC in am.       Hospital Course  The patient is a 69 year old male with past medical history of tobacco abuse who presented to [facility] on 10/12/2015 with substernal chest discomfort has been going on for 2 days. Troponin was elevated on arrival. He was given 324 mg aspirin and 300 mg Plavix in the ED. He was admitted to cardiology service, troponin initially went up to 6.48 and subsequently trended back down. Lipid panel obtained on 11/13 showed cholesterol 211, triglyceride 126, LDL 114, HDL 72. Hemoglobin A1c came back mildly elevated at 6.2 which make him prediabetic. He was started on heparin drip. Initially, he did not want to undergo cardiac catheterization, however after explaining the benefits and risk of the procedure, he agreed to undergo the procedure on 11/14.  Echocardiogram obtained on 11/14 showed EF 40-45%, severe hypokinesis of the entire inferolateral, and inferior myocardium, grade 2 diastolic dysfunction, mild MR/TR. He underwent the procedure on the same day which showed 99% proximal to mid left circumflex lesion which was treated with DES (PROMUS PREM MR 3.5X12), 15% proximal to mid RCA lesion. He was seen on the following morning on 10/14/2015 at which time he was doing well without further chest pain. He is deemed stable for discharge from cardiology perspective. Emphasis has been compared placed on compliance with dual antiplatelets therapy given new stent. We were unable to up titrate his low-dose metoprolol as he is bradycardic. He had some degree of AKI during this admission along with hypotension, therefore we are holding off on starting ACE inhibitor and ARB. Emphasis has also been placed on tobacco cessation. He is deemed stable for discharge from  cardiology perspective. 1 week TOC followup has been arranged. He has  been instructed to followup with his PCP to help monitor and manage hyperlipidemia and prediabetes.   Discharge Vitals Blood pressure 117/84, pulse 68, temperature 98.1 F (36.7 C), temperature source Oral, resp. rate 18, height '5\' 11"'$  (1.803 m), weight 160 lb (72.576 kg), SpO2 100 %.  Filed Weights   10/11/15 2014 10/12/15 0200 10/14/15 0346  Weight: 170 lb (77.111 kg) 160 lb 11.5 oz (72.9 kg) 160 lb (72.576 kg)    Labs  CBC  Recent Labs  10/13/15 0422 10/14/15 0312  WBC 10.0 10.1  HGB 13.5 13.2  HCT 43.3 41.3  MCV 85.6 85.0  PLT 223 924   Basic Metabolic Panel  Recent Labs  10/13/15 0623 10/14/15 0312  NA 138 138  K 3.7 3.8  CL 102 104  CO2 27 23  GLUCOSE 143* 131*  BUN 9 10  CREATININE 1.42* 1.27*  CALCIUM 8.9 8.7*   Cardiac Enzymes  Recent Labs  10/12/15 0814 10/12/15 1500  TROPONINI 6.48* 5.13*   Hemoglobin A1C  Recent Labs  10/12/15 0351  HGBA1C 6.2*   Fasting Lipid Panel  Recent Labs  10/12/15 0352  CHOL 211*  HDL 72  LDLCALC 114*  TRIG 126  CHOLHDL 2.9    Disposition  Pt is being discharged home today in good condition.  Follow-up Plans & Appointments      Follow-up Information    Follow up with Pixie Casino, MD On 10/22/2015.   Specialty:  Cardiology   Why:  3:15am. Cardiology followup   Contact information:   [place] [address] [phone]       Follow up with Blanchie Serve, MD.   Specialty:  Internal Medicine   Why:  Please followup with your primary care provider as soon as possible to help control cholesterol and prediabetes   Contact information:   [place] [address] [phone]       Discharge Medications    Medication List    TAKE these medications        allopurinol 100 MG tablet  Commonly known as:  ZYLOPRIM  Take 1 tablet (100 mg total) by mouth daily.     aspirin 81  MG EC tablet  Take 1 tablet (81 mg total) by mouth daily.     atorvastatin 40 MG tablet  Commonly known as:  LIPITOR  Take 1 tablet (40 mg total) by mouth daily at 6 PM.     clopidogrel 75 MG tablet  Commonly known as:  PLAVIX  Take 1 tablet (75 mg total) by mouth daily with breakfast.     metoprolol tartrate 25 MG tablet  Commonly known as:  LOPRESSOR  Take 0.5 tablets (12.5 mg total) by mouth 2 (two) times daily.     nitroGLYCERIN 0.4 MG SL tablet  Commonly known as:  NITROSTAT  Place 1 tablet (0.4 mg total) under the tongue every 5 (five) minutes x 3 doses as needed for chest pain.     pantoprazole 40 MG tablet  Commonly known as:  PROTONIX  Take 1 tablet (40 mg total) by mouth daily.        Duration of Discharge Encounter   Greater than 30 minutes including physician time.  Hilbert Corrigan PA-C Pager: [phone] 10/14/2015, 12:46 PM

## 2015-10-17 ENCOUNTER — Telehealth: Payer: Self-pay | Admitting: *Deleted

## 2015-10-17 NOTE — Telephone Encounter (Signed)
Faxed orders for phase 2 cardiac rehab - no GXT required.  

## 2015-10-22 ENCOUNTER — Encounter: Payer: Self-pay | Admitting: Internal Medicine

## 2015-10-22 ENCOUNTER — Ambulatory Visit (INDEPENDENT_AMBULATORY_CARE_PROVIDER_SITE_OTHER): Payer: Medicare PPO | Admitting: Internal Medicine

## 2015-10-22 VITALS — BP 114/70 | HR 83 | Ht 71.0 in | Wt 165.0 lb

## 2015-10-22 DIAGNOSIS — M109 Gout, unspecified: Secondary | ICD-10-CM | POA: Insufficient documentation

## 2015-10-22 DIAGNOSIS — I2510 Atherosclerotic heart disease of native coronary artery without angina pectoris: Secondary | ICD-10-CM

## 2015-10-22 DIAGNOSIS — I10 Essential (primary) hypertension: Secondary | ICD-10-CM

## 2015-10-22 DIAGNOSIS — I255 Ischemic cardiomyopathy: Secondary | ICD-10-CM

## 2015-10-22 DIAGNOSIS — M1000 Idiopathic gout, unspecified site: Secondary | ICD-10-CM

## 2015-10-22 DIAGNOSIS — I214 Non-ST elevation (NSTEMI) myocardial infarction: Secondary | ICD-10-CM | POA: Diagnosis not present

## 2015-10-22 MED ORDER — COLCHICINE 0.6 MG PO TABS: 0.6000 mg | ORAL_TABLET | Freq: Two times a day (BID) | ORAL | Status: DC
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
## 2015-10-22 NOTE — Patient Instructions (Addendum)
Your physician wants you to follow-up in: 6 months with Dr. Debara Pickett. You will receive a reminder letter in the mail two months in advance. If you don't receive a letter, please call our office to schedule the follow-up appointment.  Dr. Debara Pickett has prescribed COLCHICINE.  This medications is for gout flare ups Please take this medication every 12 hours or twice daily for 2 weeks If your symptoms have improved after 2 weeks, you can take this as needed.  You will need to obtain future refills from your primary care doctor.

## 2015-11-06 ENCOUNTER — Telehealth: Payer: Self-pay | Admitting: Internal Medicine

## 2015-11-06 NOTE — Telephone Encounter (Signed)
Spoke with Mrs. Prust, says Mr. Fitzpatrick is still with our office, he has not transferred.   Says she will have him to call us back to schedule an appointment..Carolin Coy

## 2015-12-23 ENCOUNTER — Telehealth (HOSPITAL_COMMUNITY): Payer: Self-pay | Admitting: Cardiac Rehabilitation

## 2015-12-23 NOTE — Telephone Encounter (Signed)
pc to pt to discuss enrolling in cardiac rehab.  Lm on am. Letter mailed to pt 10/28/15 with no response.  This is third and final attempt to contact pt.

## 2015-12-26 ENCOUNTER — Telehealth: Payer: Self-pay | Admitting: Internal Medicine

## 2015-12-26 NOTE — Telephone Encounter (Signed)
Called pt, left message with wife to have Mr. Kaplan call and schedule flu shot appt and visit...cdavis

## 2017-07-14 ENCOUNTER — Ambulatory Visit (HOSPITAL_COMMUNITY)
Admission: EM | Admit: 2017-07-14 | Discharge: 2017-07-14 | Disposition: A | Discharge status: Home / Self Care | Payer: Medicare Other | Attending: Family Medicine | Admitting: Family Medicine

## 2017-07-14 DIAGNOSIS — R739 Hyperglycemia, unspecified: Secondary | ICD-10-CM | POA: Diagnosis not present

## 2017-07-14 LAB — POCT I-STAT, CHEM 8
BUN: 17 mg/dL (ref 6–20)
CALCIUM ION: 1.16 mmol/L (ref 1.15–1.40)
CREATININE: 1.3 mg/dL — AB (ref 0.61–1.24)
Chloride: 101 mmol/L (ref 101–111)
GLUCOSE: 238 mg/dL — AB (ref 65–99)
HEMATOCRIT: 44 % (ref 39.0–52.0)
HEMOGLOBIN: 15 g/dL (ref 13.0–17.0)
Potassium: 4.4 mmol/L (ref 3.5–5.1)
Sodium: 138 mmol/L (ref 135–145)
TCO2: 27 mmol/L (ref 0–100)

## 2017-07-14 LAB — GLUCOSE, CAPILLARY: Glucose-Capillary: 248 mg/dL — ABNORMAL HIGH (ref 65–99)

## 2017-07-14 MED ORDER — METFORMIN HCL 1000 MG PO TABS: 500.0000 mg | ORAL_TABLET | Freq: Two times a day (BID) | ORAL | 0 refills | Status: DC

## 2017-07-14 NOTE — ED Notes (Signed)
Pt triaged by physician

## 2017-07-19 NOTE — ED Provider Notes (Signed)
  [place]   [id number] 07/14/17 Arrival Time: 8921  ASSESSMENT & PLAN:  1. Hyperglycemia     Meds ordered this encounter  Medications  . metFORMIN (GLUCOPHAGE) 1000 MG tablet    Sig: Take 0.5 tablets (500 mg total) by mouth 2 (two) times daily.    Dispense:  30 tablet    Refill:  0   Will schedule f/u with his PCP. Discussed importance. Start Metformin but instructed he will likely need to increase dose after he sees his PCP. May f/u here as needed.  Reviewed expectations re: course of current medical issues. Questions answered. Outlined signs and symptoms indicating need for more acute intervention. Patient verbalized understanding. After Visit Summary given.   SUBJECTIVE:  Frank Hart is a 71 y.o. male who presents with complaint of elevated blood sugar. Home nurse visit recently. Told his HgA1c was 11-12. H/O increased BS in past. No treatment. Does report frequent thirst and increased frequency of urination. Weight stable. Normal PO intake. Otherwise reports he feels well. No vision or hearing changes reported.  + FH of DM reported  ROS: As per HPI.   OBJECTIVE:  Vitals:   07/14/17 1401  BP: 109/62  Pulse: 86  Resp: 14  Temp: 98.3 F (36.8 C)  TempSrc: Oral  SpO2: 98%     General appearance: alert; no distress HEENT: normocephalic; atraumatic; conjunctivae normal; TMs normal; nasal mucosa normal; oral mucosa normal Neck: supple Lungs: clear to auscultation bilaterally Heart: regular rate and rhythm Abdomen: soft, non-tender; bowel sounds normal Extremities: no cyanosis or edema; symmetrical with no gross deformities Skin: warm and dry Psychological:  alert and cooperative; normal mood and affect  Results for orders placed or performed during the hospital encounter of 07/14/17  Glucose, capillary  Result Value Ref Range   Glucose-Capillary 248 (H) 65 - 99 mg/dL  I-STAT, chem 8  Result Value Ref Range   Sodium 138 135 - 145 mmol/L   Potassium 4.4 3.5 - 5.1 mmol/L   Chloride 101 101 - 111 mmol/L   BUN 17 6 - 20 mg/dL   Creatinine, Ser 1.30 (H) 0.61 - 1.24 mg/dL   Glucose, Bld 238 (H) 65 - 99 mg/dL   Calcium, Ion 1.16 1.15 - 1.40 mmol/L   TCO2 27 0 - 100 mmol/L   Hemoglobin 15.0 13.0 - 17.0 g/dL   HCT 44.0 39.0 - 52.0 %    Labs Reviewed  GLUCOSE, CAPILLARY - Abnormal; Notable for the following:       Result Value   Glucose-Capillary 248 (*)    All other components within normal limits  POCT I-STAT, CHEM 8 - Abnormal; Notable for the following:    Creatinine, Ser 1.30 (*)    Glucose, Bld 238 (*)    All other components within normal limits    No results found.  Allergies  Allergen Reactions  . Asa [Aspirin] Other (See Comments)    Trigger stomach issues     PMHx, SurgHx, SocialHx, Medications, and Allergies were reviewed in the Visit Navigator and updated as appropriate.      Vanessa Kick, MD 07/19/17 1000

## 2018-03-05 ENCOUNTER — Encounter (HOSPITAL_COMMUNITY): Payer: Self-pay | Admitting: Emergency Medicine

## 2018-03-05 ENCOUNTER — Emergency Department (HOSPITAL_COMMUNITY)
Admission: EM | Admit: 2018-03-05 | Discharge: 2018-03-05 | Disposition: A | Discharge status: Home / Self Care | Payer: Medicare Other | Attending: Emergency Medicine | Admitting: Emergency Medicine

## 2018-03-05 DIAGNOSIS — F17210 Nicotine dependence, cigarettes, uncomplicated: Secondary | ICD-10-CM | POA: Diagnosis not present

## 2018-03-05 DIAGNOSIS — I119 Hypertensive heart disease without heart failure: Secondary | ICD-10-CM | POA: Diagnosis not present

## 2018-03-05 DIAGNOSIS — B029 Zoster without complications: Secondary | ICD-10-CM | POA: Diagnosis not present

## 2018-03-05 DIAGNOSIS — Z79899 Other long term (current) drug therapy: Secondary | ICD-10-CM | POA: Diagnosis not present

## 2018-03-05 DIAGNOSIS — I2510 Atherosclerotic heart disease of native coronary artery without angina pectoris: Secondary | ICD-10-CM | POA: Diagnosis not present

## 2018-03-05 MED ORDER — OXYCODONE-ACETAMINOPHEN 5-325 MG PO TABS: 1.0000 | ORAL_TABLET | ORAL | 0 refills | Status: DC | PRN

## 2018-03-05 MED ORDER — OXYCODONE-ACETAMINOPHEN 5-325 MG PO TABS
2.0000 | ORAL_TABLET | Freq: Once | ORAL | Status: AC
  Administered 2018-03-05: 2 via ORAL
  Filled 2018-03-05: qty 2

## 2018-03-05 NOTE — Discharge Instructions (Signed)
Take the prescribed medication as directed.  Cotninue taking the ACYCLOVIR (green pill).  You can stop the other (gabapentin) if it is not helping you. Follow-up with your doctor. Return to the ED for new or worsening symptoms.

## 2018-03-05 NOTE — ED Triage Notes (Signed)
Reports being diagnosed with shingles yesterday.  Taking valtrex and gabapentin.  Reports no relief of pain.  Lesions reported to right side of neck, shoulder, chest, and arm.
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
## 2018-04-11 ENCOUNTER — Encounter (HOSPITAL_COMMUNITY): Payer: Self-pay | Admitting: Emergency Medicine

## 2018-04-11 ENCOUNTER — Emergency Department (HOSPITAL_COMMUNITY)
Admission: EM | Admit: 2018-04-11 | Discharge: 2018-04-11 | Disposition: A | Discharge status: Home / Self Care | Payer: Medicare Other | Attending: Emergency Medicine | Admitting: Emergency Medicine

## 2018-04-11 DIAGNOSIS — I2510 Atherosclerotic heart disease of native coronary artery without angina pectoris: Secondary | ICD-10-CM | POA: Diagnosis not present

## 2018-04-11 DIAGNOSIS — H6091 Unspecified otitis externa, right ear: Secondary | ICD-10-CM | POA: Insufficient documentation

## 2018-04-11 DIAGNOSIS — Z79899 Other long term (current) drug therapy: Secondary | ICD-10-CM | POA: Diagnosis not present

## 2018-04-11 DIAGNOSIS — F17210 Nicotine dependence, cigarettes, uncomplicated: Secondary | ICD-10-CM | POA: Diagnosis not present

## 2018-04-11 DIAGNOSIS — I10 Essential (primary) hypertension: Secondary | ICD-10-CM | POA: Insufficient documentation

## 2018-04-11 DIAGNOSIS — H9201 Otalgia, right ear: Secondary | ICD-10-CM | POA: Diagnosis present

## 2018-04-11 DIAGNOSIS — Z7982 Long term (current) use of aspirin: Secondary | ICD-10-CM | POA: Insufficient documentation

## 2018-04-11 DIAGNOSIS — H60501 Unspecified acute noninfective otitis externa, right ear: Secondary | ICD-10-CM

## 2018-04-11 MED ORDER — OFLOXACIN 0.3 % OT SOLN: 5.0000 [drp] | Freq: Two times a day (BID) | OTIC | 0 refills | Status: AC

## 2018-04-11 NOTE — ED Notes (Signed)
Pt left without prescription or discharge papers. This nurse tried to explain to him that there was a lot going on with the other patients and he would be getting his papers shortly. He decided to leave.
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
## 2018-04-11 NOTE — Discharge Instructions (Addendum)
Take the eardrops to completion.  You may take ibuprofen or Tylenol as needed for pain.  Do not submerge the ear in water and do not go swimming.  Follow-up with your primary care physician for reevaluation of your symptoms.  Return to the emergency department immediately for any concerning signs or symptoms develop such as fever, worsening pain, or redness behind the ear.

## 2018-04-11 NOTE — ED Notes (Signed)
Pt walked out of Pod A.

## 2018-04-11 NOTE — ED Triage Notes (Signed)
Patient to ED c/o R ear pain today. Reports it feels full and decreased hearing and thinks it needs to be flushed. Denies fevers/chills.

## 2018-10-01 ENCOUNTER — Other Ambulatory Visit: Payer: Self-pay

## 2018-10-01 ENCOUNTER — Emergency Department (HOSPITAL_COMMUNITY)
Admission: EM | Admit: 2018-10-01 | Discharge: 2018-10-01 | Disposition: A | Discharge status: Home / Self Care | Payer: Medicare Other | Attending: Emergency Medicine | Admitting: Emergency Medicine

## 2018-10-01 ENCOUNTER — Encounter (HOSPITAL_COMMUNITY): Payer: Self-pay | Admitting: Emergency Medicine

## 2018-10-01 DIAGNOSIS — Z79899 Other long term (current) drug therapy: Secondary | ICD-10-CM | POA: Insufficient documentation

## 2018-10-01 DIAGNOSIS — Z7982 Long term (current) use of aspirin: Secondary | ICD-10-CM | POA: Diagnosis not present

## 2018-10-01 DIAGNOSIS — I252 Old myocardial infarction: Secondary | ICD-10-CM | POA: Diagnosis not present

## 2018-10-01 DIAGNOSIS — L237 Allergic contact dermatitis due to plants, except food: Secondary | ICD-10-CM

## 2018-10-01 DIAGNOSIS — L299 Pruritus, unspecified: Secondary | ICD-10-CM | POA: Diagnosis present

## 2018-10-01 DIAGNOSIS — Z7902 Long term (current) use of antithrombotics/antiplatelets: Secondary | ICD-10-CM | POA: Diagnosis not present

## 2018-10-01 DIAGNOSIS — Z7984 Long term (current) use of oral hypoglycemic drugs: Secondary | ICD-10-CM | POA: Insufficient documentation

## 2018-10-01 DIAGNOSIS — I2510 Atherosclerotic heart disease of native coronary artery without angina pectoris: Secondary | ICD-10-CM | POA: Diagnosis not present

## 2018-10-01 DIAGNOSIS — I10 Essential (primary) hypertension: Secondary | ICD-10-CM | POA: Insufficient documentation

## 2018-10-01 DIAGNOSIS — F17210 Nicotine dependence, cigarettes, uncomplicated: Secondary | ICD-10-CM | POA: Insufficient documentation

## 2018-10-01 MED ORDER — TRIAMCINOLONE ACETONIDE 0.025 % EX OINT: 1.0000 "application " | TOPICAL_OINTMENT | Freq: Two times a day (BID) | CUTANEOUS | 1 refills | Status: DC

## 2018-10-01 MED ORDER — PREDNISONE 20 MG PO TABS: 20.0000 mg | ORAL_TABLET | Freq: Every day | ORAL | 0 refills | Status: AC

## 2018-10-01 NOTE — Discharge Instructions (Signed)
Doctor in 3 days if no improvement, ER for worsening symptoms

## 2018-10-01 NOTE — ED Triage Notes (Signed)
Pt states he "got into some poison oak" the other day and it keeps getting worse. Raised rash noted to both arms.  Endorses itching.

## 2018-10-01 NOTE — ED Notes (Signed)
Provided discharge instructions, pt and family left at this time with all belongings.
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
## 2020-11-05 ENCOUNTER — Ambulatory Visit (INDEPENDENT_AMBULATORY_CARE_PROVIDER_SITE_OTHER): Payer: Medicare PPO | Admitting: Student

## 2020-11-05 ENCOUNTER — Encounter: Payer: Self-pay | Admitting: Student

## 2020-11-05 ENCOUNTER — Ambulatory Visit (HOSPITAL_COMMUNITY)
Admission: RE | Admit: 2020-11-05 | Discharge: 2020-11-05 | Disposition: A | Discharge status: Home / Self Care | Payer: Medicare PPO | Source: Ambulatory Visit | Attending: Internal Medicine | Admitting: Internal Medicine

## 2020-11-05 ENCOUNTER — Encounter: Payer: Self-pay | Admitting: *Deleted

## 2020-11-05 ENCOUNTER — Other Ambulatory Visit: Payer: Self-pay

## 2020-11-05 VITALS — BP 131/84 | HR 85 | Temp 97.6°F | Ht 71.0 in | Wt 151.3 lb

## 2020-11-05 DIAGNOSIS — M109 Gout, unspecified: Secondary | ICD-10-CM

## 2020-11-05 DIAGNOSIS — I499 Cardiac arrhythmia, unspecified: Secondary | ICD-10-CM | POA: Insufficient documentation

## 2020-11-05 DIAGNOSIS — I2510 Atherosclerotic heart disease of native coronary artery without angina pectoris: Secondary | ICD-10-CM | POA: Diagnosis not present

## 2020-11-05 DIAGNOSIS — E782 Mixed hyperlipidemia: Secondary | ICD-10-CM | POA: Diagnosis not present

## 2020-11-05 DIAGNOSIS — N289 Disorder of kidney and ureter, unspecified: Secondary | ICD-10-CM

## 2020-11-05 DIAGNOSIS — E139 Other specified diabetes mellitus without complications: Secondary | ICD-10-CM

## 2020-11-05 DIAGNOSIS — Z0000 Encounter for general adult medical examination without abnormal findings: Secondary | ICD-10-CM

## 2020-11-05 LAB — POCT GLYCOSYLATED HEMOGLOBIN (HGB A1C): Hemoglobin A1C: 8.1 % — AB (ref 4.0–5.6)

## 2020-11-05 LAB — GLUCOSE, CAPILLARY: Glucose-Capillary: 169 mg/dL — ABNORMAL HIGH (ref 70–99)

## 2020-11-05 MED ORDER — CLOPIDOGREL BISULFATE 75 MG PO TABS: 75.0000 mg | ORAL_TABLET | Freq: Every day | ORAL | 11 refills | Status: DC

## 2020-11-05 NOTE — Patient Instructions (Signed)
Frank Hart,  It is a pleasure seeing you in the clinic today.  Here is a summary what we talked about:  1: History of heart stent: I will restart your blood thinner with Plavix 75 mg daily.  I will also check your cholesterol and hemoglobin A1c.  2.  Gout: I will check uric acid level and BMP for your kidney function before starting you on gout medication.  3.  Irregular heart rate: EKG shows several extra beats but they are not serious or concerning.  I also send out referral for an eye doctor.  Take care  Dr. Alfonse Spruce

## 2020-11-06 DIAGNOSIS — E119 Type 2 diabetes mellitus without complications: Secondary | ICD-10-CM | POA: Insufficient documentation

## 2020-11-06 LAB — BMP8+ANION GAP
Anion Gap: 14 mmol/L (ref 10.0–18.0)
BUN/Creatinine Ratio: 10 (ref 10–24)
BUN: 13 mg/dL (ref 8–27)
CO2: 25 mmol/L (ref 20–29)
Calcium: 9.6 mg/dL (ref 8.6–10.2)
Chloride: 102 mmol/L (ref 96–106)
Creatinine, Ser: 1.3 mg/dL — ABNORMAL HIGH (ref 0.76–1.27)
GFR calc Af Amer: 62 mL/min/{1.73_m2} (ref >59)
GFR calc non Af Amer: 53 mL/min/{1.73_m2} — ABNORMAL LOW (ref >59)
Glucose: 158 mg/dL — ABNORMAL HIGH (ref 65–99)
Potassium: 4.7 mmol/L (ref 3.5–5.2)
Sodium: 141 mmol/L (ref 134–144)

## 2020-11-06 LAB — LIPID PANEL
Chol/HDL Ratio: 2.8 ratio (ref 0.0–5.0)
Cholesterol, Total: 217 mg/dL — ABNORMAL HIGH (ref 100–199)
HDL: 78 mg/dL (ref >39)
LDL Chol Calc (NIH): 124 mg/dL — ABNORMAL HIGH (ref 0–99)
Triglycerides: 87 mg/dL (ref 0–149)
VLDL Cholesterol Cal: 15 mg/dL (ref 5–40)

## 2020-11-06 LAB — URIC ACID: Uric Acid: 6.7 mg/dL (ref 3.8–8.4)

## 2020-11-06 NOTE — Assessment & Plan Note (Signed)
Patient has history of gout and per chart review, he was on allopurinol and colchicine.  His last gout flare was 2-3 months ago and locates at his left big toe.  States that his flare usually triggered by red meat consumption.  Assessment and plan: Uric acid level 6.7.  Goal uric acid < 6.  Given his kidney function, will start with allopurinol 50 mg daily. -Allopurinol 50 mg daily

## 2020-11-06 NOTE — Assessment & Plan Note (Signed)
Hemoglobin A1c today is 8.1.  Patient states that he was prescribed a medication for diabetes, possible Metformin.  Patient is unsure of the dose.  Assessment and plan: -Will restart Metformin at 50 mg once a day. -Referral for eye exam -A1c in 3 months

## 2020-11-06 NOTE — Assessment & Plan Note (Addendum)
Per chart review, patient was hospitalized for NSTEMI and had a heart catheter on 2016.  Heart cath showed 99% stenosis of the proximal-mid left circumflex status post DES placed.  Patient reports that he was on a blood thinner but could not remember the name.  States that he is allergic to aspirin and refused to take any aspirin.  Heart auscultation review normal rate with irregular rhythm.  EKG obtained shows sinus rhythm with PACs.  Assessment and plan Given his history of CAD and DES, will start patient on Plavix 75 mg daily.

## 2020-11-06 NOTE — Assessment & Plan Note (Signed)
ASCVD risk 42-36%.  Patient is recommended to start a high intensity statin.  Will start atorvastatin 80 mg. -Start atorvastatin 80 mg

## 2020-11-06 NOTE — Addendum Note (Signed)
Addended byGaylan Gerold on: 11/06/2020 03:24 PM   Modules accepted: Level of Service

## 2020-11-06 NOTE — Progress Notes (Addendum)
CC: Establishing care and gout management  HPI:  [name] is a 74 y.o. past medical history of CAD, hyperlipidemia, hypertension, gout who is here to the clinic for establishing care.  Patient last saw PCP on [facility] last year but lost to follow-up.  Patient states that he is feeling well with no acute complaints.  Patient lives at home with his wife and is independent of ADLs.  Patient states that he was on a blood thinner medication, the cholesterol medication and her diabetic medication, possibly Metformin.  He states that he is allergic to aspirin.  States every time he take aspirin is because concern of stomach issue.  Per chart review patient have a heart cath which show 99% stenosis of the left circumflex and he had a drug-eluting stent placed.  Patient did not remember that he had a stent placed and is not following with any cardiology.  Patient reports history of gout in the past and states that he was given a gout medication to take every time he has a gout flare.  His last gout flare was 2-3 months ago and locates at the left big toe.  Social history Lives with his wife, independent with ADLs Occasional beer and liquor use Smoke half a pack a day for 60 years Smoke pot occasionally  Family history Father-heart attack  Surgical history Had a abdominal surgery 15 years ago, unsure of what type Heart cath and stent placement in 2016  Allergies Aspirin-stomach upset  Past Medical History:  Diagnosis Date  . CAD (coronary artery disease)    cath 10/13/2015 1v dx DES to prox to mid LCx, EF 40-45% by echo  . Hyperlipidemia   . Hypertension   . Ischemic cardiomyopathy    EF 40-45% on echo 10/13/2015 after NSTEMI  . Prediabetes    A1C 6.2 on 10/12/2015  . Tobacco abuse 10/14/2015   Review of Systems:  Review of Systems  Constitutional: Negative.   Respiratory: Negative for shortness of breath.   Cardiovascular: Negative for chest pain.  Gastrointestinal:  Negative.   Musculoskeletal: Negative for joint pain.     Physical Exam:  Vitals:   11/05/20 1329  BP: 131/84  Pulse: 85  Temp: 97.6 F (36.4 C)  TempSrc: Oral  SpO2: 100%  Weight: 151 lb 4.8 oz (68.6 kg)  Height: 5\' 11"  (1.803 m)   Physical Exam Constitutional:      General: He is not in acute distress. HENT:     Head: Normocephalic.  Eyes:     General:        Right eye: No discharge.        Left eye: No discharge.  Cardiovascular:     Rate and Rhythm: Normal rate. Rhythm irregular.  Pulmonary:     Effort: No respiratory distress.     Breath sounds: Normal breath sounds.  Musculoskeletal:     Right lower leg: No edema.     Left lower leg: No edema.     Comments: Left big toe nontender to palpation nonerythematous nonedematous.  Neurological:     Mental Status: He is alert.  Psychiatric:        Mood and Affect: Mood normal.     Assessment & Plan:   See Encounters Tab for problem based charting.  Patient seen with Dr. Jimmye Norman   Multiple attempts to contact patient through phone but unsuccessful.  Also unsuccessful to reach to his other point of contacts.  Patient does not have a follow-up appointment.  Will hold off on starting any medication at this time.

## 2020-11-06 NOTE — Assessment & Plan Note (Signed)
Creatinine 1.3.  Seems to be at baseline.

## 2020-11-07 NOTE — Progress Notes (Signed)
Internal Medicine Clinic Attending  I saw and evaluated the patient.  I personally confirmed the key portions of the history and exam documented by Dr. Nguyen and I reviewed pertinent patient test results.  The assessment, diagnosis, and plan were formulated together and I agree with the documentation in the resident's note.\  

## 2021-01-27 ENCOUNTER — Ambulatory Visit (INDEPENDENT_AMBULATORY_CARE_PROVIDER_SITE_OTHER): Payer: Medicare PPO | Admitting: Student

## 2021-01-27 ENCOUNTER — Other Ambulatory Visit: Payer: Self-pay

## 2021-01-27 ENCOUNTER — Encounter: Payer: Self-pay | Admitting: Student

## 2021-01-27 VITALS — BP 128/73 | HR 89 | Temp 98.2°F | Ht 71.0 in | Wt 145.9 lb

## 2021-01-27 DIAGNOSIS — M109 Gout, unspecified: Secondary | ICD-10-CM

## 2021-01-27 DIAGNOSIS — R1310 Dysphagia, unspecified: Secondary | ICD-10-CM | POA: Insufficient documentation

## 2021-01-27 DIAGNOSIS — R638 Other symptoms and signs concerning food and fluid intake: Secondary | ICD-10-CM

## 2021-01-27 DIAGNOSIS — E1169 Type 2 diabetes mellitus with other specified complication: Secondary | ICD-10-CM

## 2021-01-27 DIAGNOSIS — I2510 Atherosclerotic heart disease of native coronary artery without angina pectoris: Secondary | ICD-10-CM | POA: Diagnosis not present

## 2021-01-27 DIAGNOSIS — E782 Mixed hyperlipidemia: Secondary | ICD-10-CM

## 2021-01-27 DIAGNOSIS — Z720 Tobacco use: Secondary | ICD-10-CM | POA: Diagnosis not present

## 2021-01-27 MED ORDER — ATORVASTATIN CALCIUM 40 MG PO TABS: 40.0000 mg | ORAL_TABLET | Freq: Every day | ORAL | 2 refills | Status: DC

## 2021-01-27 MED ORDER — METFORMIN HCL 1000 MG PO TABS
1000.0000 mg | ORAL_TABLET | Freq: Every day | ORAL | 11 refills | Status: DC
Start: 2021-01-27 — End: 2021-07-02

## 2021-01-27 NOTE — Progress Notes (Signed)
   CC: Dysphagia  HPI:  [name] is a 75 y.o. with past medical history of CAD, hyperlipidemia, hypertension, gout who presented to the clinic for chief complaint of dysphagia.  Please see problem based charting for further detail  Past Medical History:  Diagnosis Date  . CAD (coronary artery disease)    cath 10/13/2015 1v dx DES to prox to mid LCx, EF 40-45% by echo  . Hyperlipidemia   . Hypertension   . Ischemic cardiomyopathy    EF 40-45% on echo 10/13/2015 after NSTEMI  . Prediabetes    A1C 6.2 on 10/12/2015  . Tobacco abuse 10/14/2015   Review of Systems: As per HPI  Physical Exam:  Vitals:   01/27/21 0833  BP: 128/73  Pulse: 89  Temp: 98.2 F (36.8 C)  TempSrc: Oral  SpO2: 100%  Weight: 145 lb 14.4 oz (66.2 kg)  Height: 5\' 11"  (1.803 m)   Physical Exam Constitutional:      General: He is not in acute distress. HENT:     Head: Normocephalic.  Eyes:     General:        Right eye: No discharge.        Left eye: No discharge.  Neck:     Comments: No adenopathy or swelling of thyroid gland Cardiovascular:     Rate and Rhythm: Normal rate and regular rhythm.  Pulmonary:     Effort: Pulmonary effort is normal. No respiratory distress.  Abdominal:     General: Bowel sounds are normal.     Tenderness: There is no abdominal tenderness.  Musculoskeletal:     Cervical back: No rigidity.     Right lower leg: No edema.     Left lower leg: No edema.     Comments: Dorsalis pedis pulse palpated.  Bilateral feet warm to touch.  No obvious wounds or ulcers  Lymphadenopathy:     Cervical: No cervical adenopathy.  Skin:    General: Skin is warm.  Neurological:     Mental Status: He is alert.  Psychiatric:        Mood and Affect: Mood normal.     Assessment & Plan:   See Encounters Tab for problem based charting.  Patient discussed with Dr. Jimmye Norman

## 2021-01-27 NOTE — Assessment & Plan Note (Signed)
His ASCVD was 42% -Start atorvastatin 40 mg

## 2021-01-27 NOTE — Assessment & Plan Note (Signed)
Patient endorses issue with swallowing pills, food and sometimes waters.  He states that it sometimes stuck at his throat.  Endorses occasional odynophagia.  States that it started 4-5 years ago.  Denies coughing, choking or food regurgitation.  Denies melena or blood in stool or constipation.  States that he has issue with maintaining his weight.  Patient states that he cooks his own food and eat nutritious and healthy meals every day.  States that he never had a colonoscopy done in the past.  Assessment and plan His dysphagia and odynophagia is concerning, especially with the weight loss (151lbs in Dec, 145 lbs now).  Discussed with patient about possible GI referral for upper and lower endoscopy to evaluate for his dysphagia and also colon cancer screening.  Patient is hesitant with doing a procedure.  We will proceed with an esophagram and FOBT at this time. -Order esophagram -Order FOBT for colon cancer screening

## 2021-01-27 NOTE — Assessment & Plan Note (Signed)
Patient smokes 1 pack every 3 days.  He has been smoking for more than 50 years.  He expressed that he would like to quit. -Will offer cessation treatment such as nicotine patch or Chantix on next visit

## 2021-01-27 NOTE — Assessment & Plan Note (Signed)
Denies chest pain or shortness of breath.  Patient is functional and does everything independently. -Continue Plavix

## 2021-01-27 NOTE — Patient Instructions (Signed)
Mr. Frank Hart,  It is a pleasure seeing you in the clinic today.  Here is a summary of what we talked about:  1.  Problem swallowing: I ordered an esophagram, which can show [place] why you having problems swallowing.  I also ordered a stool test to check for blood in the stool.  This is a method of colon cancer screening.  2.  High cholesterol: I sent a prescription of atorvastatin 40 mg to [facility].  Please take 1 tablet a day  3.  Diabetes: Your hemoglobin A1c was 8.1.  Please start taking Metformin 1000 mg once a day.  4.  Gout: I will not start medication today.  Please let me know if you have gout flare more often.  Please see me back in 3 months.  Take care  Dr. Alfonse Spruce

## 2021-01-27 NOTE — Assessment & Plan Note (Signed)
His last A1c was 8.1.  He denies nausea, vomiting, polyuria or polydipsia.  His foot exam is benign.  Patient was on Metformin in the past. -Resume Metformin 1 g daily -Referral to ophthalmology -A1c in 3 months

## 2021-01-27 NOTE — Assessment & Plan Note (Signed)
His uric acid was 6.7.  However patient only had one gout flare per year.  No strong evidence suggest initiating uric acid lowering therapy.

## 2021-01-28 NOTE — Progress Notes (Signed)
Internal Medicine Clinic Attending  Case discussed with Dr. Nguyen  At the time of the visit.  We reviewed the resident's history and exam and pertinent patient test results.  I agree with the assessment, diagnosis, and plan of care documented in the resident's note. 

## 2021-03-17 ENCOUNTER — Encounter: Payer: Self-pay | Admitting: *Deleted

## 2021-03-17 NOTE — Progress Notes (Unsigned)

## 2021-03-18 ENCOUNTER — Telehealth: Payer: Self-pay | Admitting: Internal Medicine

## 2021-03-18 ENCOUNTER — Encounter: Payer: Self-pay | Admitting: *Deleted

## 2021-03-18 DIAGNOSIS — E119 Type 2 diabetes mellitus without complications: Secondary | ICD-10-CM | POA: Diagnosis not present

## 2021-03-18 DIAGNOSIS — H35413 Lattice degeneration of retina, bilateral: Secondary | ICD-10-CM | POA: Diagnosis not present

## 2021-03-18 DIAGNOSIS — H2513 Age-related nuclear cataract, bilateral: Secondary | ICD-10-CM | POA: Diagnosis not present

## 2021-03-18 LAB — HM DIABETES EYE EXAM

## 2021-03-18 NOTE — Telephone Encounter (Signed)
Things That May Be Affecting Your Health:  Alcohol  Hearing loss  Pain    Depression  Home Safety  Sexual Health   Diabetes  Lack of physical activity  Stress   Difficulty with daily activities  Loneliness  Tiredness   Drug use  Medicines  Tobacco use   Falls  Motor Vehicle Safety  Weight   Food choices  Oral Health  Other    YOUR PERSONALIZED HEALTH PLAN : 1. Schedule your next subsequent Medicare Wellness visit in one year 2. Attend all of your regular appointments to address your medical issues 3. Complete the preventative screenings and services   Annual Wellness Visit   Medicare Covered Preventative Screenings and [place] Men and Women Who How Often Need? Date of Last Service Action  Abdominal Aortic Aneurysm Adults with AAA risk factors Once  @HMIMMADMINDATE ([id number])@    Alcohol Misuse and Counseling All Adults Screening once a year if no alcohol misuse. Counseling up to 4 face to face sessions.     Bone Density Measurement  Adults at risk for osteoporosis Once every 2 yrs  @[facility]IMMADMINDATE ([id number])@    Lipid Panel Z13.6 All adults without CV disease Once every 5 yrs  @[facility]IMMADMINDATE ([id number])@     Colorectal Cancer   Stool sample or  Colonoscopy All adults 13 and older   Once every year  Every 59 years Y @[facility]IMMADMINDATE ([id number])@ @[facility]IMMADMINDATE ([id number])@ @[facility]IMMADMINDATE ([id number])@    Depression All Adults Once a year Y Today   Diabetes Screening Blood glucose, post glucose load, or GTT Z13.1  All adults at risk  Pre-diabetics  Once per year  Twice per year  @HMIMMADMINDATE ([id number])@    Diabetes  Self-Management Training All adults Diabetics 10 hrs first year; 2 hours subsequent years. Requires Copay Y    Glaucoma  Diabetics  Family history of glaucoma  African Americans 13 yrs +  Hispanic Americans 8 yrs + Annually - requires coppay Y @HMIMMADMINDATE ([id number])@    Hepatitis C Z72.89 or F19.20  High  Risk for HCV  Born between 1945 and 1965  Annually  Once      HIV Z11.4 All adults based on risk  Annually btw ages 47 & 29 regardless of risk  Annually > 65 yrs if at increased risk      Lung Cancer Screening Asymptomatic adults aged 57-77 with 1 pack yr history and current smoker OR quit within the last 15 yrs Annually Must have counseling and shared decision making documentation before first screen      Medical Nutrition Therapy Adults with   Diabetes  Renal disease  Kidney transplant within past 3 yrs 3 hours first year; 2 hours subsequent years     Obesity and Counseling All adults Screening once a year Counseling if BMI 30 or higher  Today   Tobacco Use Counseling Adults who use tobacco  Up to 8 visits in one year Y    Vaccines Z23  Hepatitis B  Influenza   Pneumonia  Adults   Once  Once every flu season  Two different vaccines separated by one year     Next Annual Wellness Visit People with Medicare Every year  Today     Services & Screenings Women Who How Often Need  Date of Last Service Action  Mammogram  Z12.31 Women over 78 One baseline ages 51-39. Annually ager 40 yrs+      Pap tests All women Annually if high risk. Every 2 yrs for normal risk women  Screening for cervical cancer with   Pap (Z01.419 nl or Z01.411abnl) &  HPV Z11.51 Women aged 57 to 48 Once every 5 yrs     Screening pelvic and breast exams All women Annually if high risk. Every 2 yrs for normal risk women     Sexually Transmitted Diseases  Chlamydia  Gonorrhea  Syphilis All at risk adults Annually for non pregnant females at increased risk         [place] Men Who How Ofter Need  Date of Last Service Action  Prostate Cancer - DRE & PSA Men over 50 Annually.  DRE might require a copay. Y       Sexually Transmitted Diseases  Syphilis All at risk adults Annually for men at increased risk      Health Maintenance List Health Maintenance  Topic  Date Due  . Hepatitis C Screening  Never done  . COVID-19 Vaccine (1) Never done  . OPHTHALMOLOGY EXAM  Never done  . URINE MICROALBUMIN  Never done  . TETANUS/TDAP  Never done  . COLONOSCOPY (Pts 45-84yrs Insurance coverage will need to be confirmed)  Never done  . COLON CANCER SCREENING ANNUAL FOBT  Never done  . PNA vac Low Risk Adult (1 of 2 - PCV13) Never done  . HEMOGLOBIN A1C  05/06/2021  . INFLUENZA VACCINE  06/29/2021  . FOOT EXAM  01/27/2022  . HPV VACCINES  Aged Out

## 2021-03-26 ENCOUNTER — Encounter (INDEPENDENT_AMBULATORY_CARE_PROVIDER_SITE_OTHER): Payer: Self-pay | Admitting: Ophthalmology

## 2021-03-31 ENCOUNTER — Encounter (INDEPENDENT_AMBULATORY_CARE_PROVIDER_SITE_OTHER): Payer: Self-pay | Admitting: Ophthalmology

## 2021-06-22 ENCOUNTER — Ambulatory Visit (INDEPENDENT_AMBULATORY_CARE_PROVIDER_SITE_OTHER): Payer: Medicare PPO

## 2021-06-22 ENCOUNTER — Other Ambulatory Visit: Payer: Self-pay

## 2021-06-22 ENCOUNTER — Ambulatory Visit (HOSPITAL_COMMUNITY)
Admission: EM | Admit: 2021-06-22 | Discharge: 2021-06-22 | Disposition: A | Discharge status: Home / Self Care | Payer: Medicare PPO | Attending: Family | Admitting: Family

## 2021-06-22 DIAGNOSIS — R059 Cough, unspecified: Secondary | ICD-10-CM | POA: Diagnosis not present

## 2021-06-22 DIAGNOSIS — J189 Pneumonia, unspecified organism: Secondary | ICD-10-CM | POA: Diagnosis not present

## 2021-06-22 DIAGNOSIS — H6121 Impacted cerumen, right ear: Secondary | ICD-10-CM | POA: Diagnosis not present

## 2021-06-22 DIAGNOSIS — R042 Hemoptysis: Secondary | ICD-10-CM | POA: Diagnosis not present

## 2021-06-22 DIAGNOSIS — R634 Abnormal weight loss: Secondary | ICD-10-CM | POA: Diagnosis not present

## 2021-06-22 MED ORDER — CARBAMIDE PEROXIDE 6.5 % OT SOLN
OTIC | Status: AC
  Filled 2021-06-22: qty 15

## 2021-06-22 MED ORDER — CARBAMIDE PEROXIDE 6.5 % OT SOLN
5.0000 [drp] | Freq: Once | OTIC | Status: AC
  Administered 2021-06-22: 5 [drp] via OTIC

## 2021-06-22 MED ORDER — DOXYCYCLINE HYCLATE 100 MG PO CAPS: 100.0000 mg | ORAL_CAPSULE | Freq: Two times a day (BID) | ORAL | 0 refills | Status: DC

## 2021-06-22 MED ORDER — LEVOFLOXACIN 500 MG PO TABS: 500.0000 mg | ORAL_TABLET | Freq: Every day | ORAL | 0 refills | Status: DC

## 2021-06-22 NOTE — ED Triage Notes (Signed)
PT reports weight loss and poor appetite . Pt also has back pain for one year.

## 2021-06-22 NOTE — Discharge Instructions (Addendum)
Recommend start Doxycycline 100mg  twice a day as directed for probable pneumonia. Also start Levaquin 500mg  once daily also for pneumonia. Continue to use Mineral oil or hydrogen peroxide for your right ear and use bulb syringe to remove wax. Continue to try to eat small frequent meals. Follow-up as planned with [facility] on Friday 06/26/21.
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
## 2021-06-22 NOTE — ED Triage Notes (Signed)
Pt reports he started to cough up blood 2 weeks ago . Pt also reports weight loss and poor appetite .

## 2021-06-26 DIAGNOSIS — Z79899 Other long term (current) drug therapy: Secondary | ICD-10-CM | POA: Diagnosis not present

## 2021-06-27 ENCOUNTER — Emergency Department (HOSPITAL_COMMUNITY): Payer: Medicare PPO

## 2021-06-27 ENCOUNTER — Encounter (HOSPITAL_COMMUNITY): Payer: Self-pay

## 2021-06-27 ENCOUNTER — Inpatient Hospital Stay (HOSPITAL_COMMUNITY)
Admission: EM | Admit: 2021-06-27 | Discharge: 2021-06-28 | DRG: 180 | Discharge status: Against Medical Advice | Payer: Medicare PPO | Attending: Internal Medicine | Admitting: Internal Medicine

## 2021-06-27 ENCOUNTER — Other Ambulatory Visit: Payer: Self-pay

## 2021-06-27 DIAGNOSIS — D649 Anemia, unspecified: Secondary | ICD-10-CM | POA: Diagnosis not present

## 2021-06-27 DIAGNOSIS — F17210 Nicotine dependence, cigarettes, uncomplicated: Secondary | ICD-10-CM | POA: Diagnosis present

## 2021-06-27 DIAGNOSIS — Z85028 Personal history of other malignant neoplasm of stomach: Secondary | ICD-10-CM | POA: Diagnosis not present

## 2021-06-27 DIAGNOSIS — I252 Old myocardial infarction: Secondary | ICD-10-CM

## 2021-06-27 DIAGNOSIS — C3412 Malignant neoplasm of upper lobe, left bronchus or lung: Principal | ICD-10-CM | POA: Diagnosis present

## 2021-06-27 DIAGNOSIS — R634 Abnormal weight loss: Secondary | ICD-10-CM | POA: Diagnosis not present

## 2021-06-27 DIAGNOSIS — N3289 Other specified disorders of bladder: Secondary | ICD-10-CM | POA: Diagnosis not present

## 2021-06-27 DIAGNOSIS — I700 Atherosclerosis of aorta: Secondary | ICD-10-CM | POA: Diagnosis present

## 2021-06-27 DIAGNOSIS — R7989 Other specified abnormal findings of blood chemistry: Secondary | ICD-10-CM | POA: Diagnosis present

## 2021-06-27 DIAGNOSIS — E785 Hyperlipidemia, unspecified: Secondary | ICD-10-CM | POA: Diagnosis present

## 2021-06-27 DIAGNOSIS — D72828 Other elevated white blood cell count: Secondary | ICD-10-CM | POA: Diagnosis not present

## 2021-06-27 DIAGNOSIS — Z20822 Contact with and (suspected) exposure to covid-19: Secondary | ICD-10-CM | POA: Diagnosis not present

## 2021-06-27 DIAGNOSIS — R636 Underweight: Secondary | ICD-10-CM | POA: Diagnosis present

## 2021-06-27 DIAGNOSIS — I10 Essential (primary) hypertension: Secondary | ICD-10-CM | POA: Diagnosis not present

## 2021-06-27 DIAGNOSIS — H6121 Impacted cerumen, right ear: Secondary | ICD-10-CM | POA: Diagnosis present

## 2021-06-27 DIAGNOSIS — I21A1 Myocardial infarction type 2: Secondary | ICD-10-CM | POA: Diagnosis not present

## 2021-06-27 DIAGNOSIS — R042 Hemoptysis: Secondary | ICD-10-CM | POA: Diagnosis not present

## 2021-06-27 DIAGNOSIS — I255 Ischemic cardiomyopathy: Secondary | ICD-10-CM | POA: Diagnosis present

## 2021-06-27 DIAGNOSIS — J9811 Atelectasis: Secondary | ICD-10-CM | POA: Diagnosis not present

## 2021-06-27 DIAGNOSIS — D729 Disorder of white blood cells, unspecified: Secondary | ICD-10-CM

## 2021-06-27 DIAGNOSIS — Z8249 Family history of ischemic heart disease and other diseases of the circulatory system: Secondary | ICD-10-CM | POA: Diagnosis not present

## 2021-06-27 DIAGNOSIS — E119 Type 2 diabetes mellitus without complications: Secondary | ICD-10-CM | POA: Diagnosis not present

## 2021-06-27 DIAGNOSIS — Z955 Presence of coronary angioplasty implant and graft: Secondary | ICD-10-CM

## 2021-06-27 DIAGNOSIS — Z681 Body mass index (BMI) 19 or less, adult: Secondary | ICD-10-CM

## 2021-06-27 DIAGNOSIS — R630 Anorexia: Secondary | ICD-10-CM | POA: Diagnosis present

## 2021-06-27 DIAGNOSIS — K8689 Other specified diseases of pancreas: Secondary | ICD-10-CM | POA: Diagnosis not present

## 2021-06-27 DIAGNOSIS — J439 Emphysema, unspecified: Secondary | ICD-10-CM | POA: Diagnosis present

## 2021-06-27 DIAGNOSIS — R918 Other nonspecific abnormal finding of lung field: Secondary | ICD-10-CM | POA: Diagnosis present

## 2021-06-27 DIAGNOSIS — Z720 Tobacco use: Secondary | ICD-10-CM | POA: Diagnosis present

## 2021-06-27 DIAGNOSIS — I2510 Atherosclerotic heart disease of native coronary artery without angina pectoris: Secondary | ICD-10-CM | POA: Diagnosis present

## 2021-06-27 DIAGNOSIS — D72829 Elevated white blood cell count, unspecified: Secondary | ICD-10-CM | POA: Diagnosis not present

## 2021-06-27 DIAGNOSIS — K6389 Other specified diseases of intestine: Secondary | ICD-10-CM | POA: Diagnosis not present

## 2021-06-27 DIAGNOSIS — K828 Other specified diseases of gallbladder: Secondary | ICD-10-CM | POA: Diagnosis not present

## 2021-06-27 DIAGNOSIS — R053 Chronic cough: Secondary | ICD-10-CM | POA: Diagnosis not present

## 2021-06-27 LAB — TROPONIN I (HIGH SENSITIVITY): Troponin I (High Sensitivity): 21 ng/L — ABNORMAL HIGH (ref <18)

## 2021-06-27 LAB — CBC WITH DIFFERENTIAL/PLATELET
Abs Immature Granulocytes: 1.02 10*3/uL — ABNORMAL HIGH (ref 0.00–0.07)
Basophils Absolute: 0.2 10*3/uL — ABNORMAL HIGH (ref 0.0–0.1)
Basophils Relative: 0 %
Eosinophils Absolute: 0.1 10*3/uL (ref 0.0–0.5)
Eosinophils Relative: 0 %
HCT: 38.7 % — ABNORMAL LOW (ref 39.0–52.0)
Hemoglobin: 12.1 g/dL — ABNORMAL LOW (ref 13.0–17.0)
Immature Granulocytes: 2 %
Lymphocytes Relative: 4 %
Lymphs Abs: 1.9 10*3/uL (ref 0.7–4.0)
MCH: 27.1 pg (ref 26.0–34.0)
MCHC: 31.3 g/dL (ref 30.0–36.0)
MCV: 86.6 fL (ref 80.0–100.0)
Monocytes Absolute: 1.8 10*3/uL — ABNORMAL HIGH (ref 0.1–1.0)
Monocytes Relative: 3 %
Neutro Abs: 46.9 10*3/uL — ABNORMAL HIGH (ref 1.7–7.7)
Neutrophils Relative %: 91 %
Platelets: 362 10*3/uL (ref 150–400)
RBC: 4.47 MIL/uL (ref 4.22–5.81)
RDW: 13.3 % (ref 11.5–15.5)
WBC: 51.8 10*3/uL (ref 4.0–10.5)
nRBC: 0 % (ref 0.0–0.2)

## 2021-06-27 LAB — BASIC METABOLIC PANEL
Anion gap: 8 (ref 5–15)
BUN: 22 mg/dL (ref 8–23)
CO2: 26 mmol/L (ref 22–32)
Calcium: 8.9 mg/dL (ref 8.9–10.3)
Chloride: 103 mmol/L (ref 98–111)
Creatinine, Ser: 1.08 mg/dL (ref 0.61–1.24)
GFR, Estimated: >60 mL/min (ref >60)
Glucose, Bld: 190 mg/dL — ABNORMAL HIGH (ref 70–99)
Potassium: 4.6 mmol/L (ref 3.5–5.1)
Sodium: 137 mmol/L (ref 135–145)

## 2021-06-27 LAB — D-DIMER, QUANTITATIVE: D-Dimer, Quant: 0.37 ug/mL-FEU (ref 0.00–0.50)

## 2021-06-27 LAB — SARS CORONAVIRUS 2 (TAT 6-24 HRS): SARS Coronavirus 2: NEGATIVE

## 2021-06-27 MED ORDER — ACETAMINOPHEN 325 MG PO TABS: 650.0000 mg | ORAL_TABLET | Freq: Four times a day (QID) | ORAL | Status: DC | PRN

## 2021-06-27 MED ORDER — IOHEXOL 350 MG/ML SOLN
100.0000 mL | Freq: Once | INTRAVENOUS | Status: AC | PRN
  Administered 2021-06-27: 100 mL via INTRAVENOUS

## 2021-06-27 MED ORDER — METFORMIN HCL 500 MG PO TABS: 1000.0000 mg | ORAL_TABLET | Freq: Every day | ORAL | Status: DC

## 2021-06-27 MED ORDER — CLOPIDOGREL BISULFATE 75 MG PO TABS: 75.0000 mg | ORAL_TABLET | Freq: Every day | ORAL | Status: DC

## 2021-06-27 MED ORDER — ACETAMINOPHEN 650 MG RE SUPP: 650.0000 mg | Freq: Four times a day (QID) | RECTAL | Status: DC | PRN

## 2021-06-27 MED ORDER — ATORVASTATIN CALCIUM 40 MG PO TABS
40.0000 mg | ORAL_TABLET | Freq: Every day | ORAL | Status: DC
  Administered 2021-06-27: 40 mg via ORAL
  Filled 2021-06-27: qty 1

## 2021-06-27 NOTE — ED Notes (Signed)
PA developed hemoptysis in triage room and PA back in for further assessment

## 2021-06-27 NOTE — ED Triage Notes (Signed)
Patient complains of ongoing loss of hearing and right ear pain x 1 week. Seen at [facility] this past Monday and states the pain is now worse. Alert and oriented, NAD

## 2021-06-27 NOTE — Hospital Course (Signed)
  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 Ewing Schlein - medical oncology will see tomorrow Dr. Raeanne Barry PCCM for bronch

## 2021-06-27 NOTE — ED Notes (Signed)
Patient transported to CT 

## 2021-06-27 NOTE — ED Notes (Signed)
Irrigated pt's R ear with peroxide and water, pt tolerated well.  One large peanut sized ball of ear wax removed from pt's R ear, and one pea sized ball of wax removed from ear, pt states that he is feeling much better.
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
## 2021-06-27 NOTE — H&P (Addendum)
Date: 06/27/2021               Patient Name:  Frank Hart MRN: [id number]  DOB: 01-Sep-1946 Age / Sex: 75 y.o., male   PCP: [place] Service: Internal Medicine Teaching Service         Attending Physician: Dr. Sid Falcon, MD    First Contact: Dr. Humphrey Rolls Pager: [phone]  Second Contact: Dr. Allyson Sabal Pager: [phone]       After Hours (After 5p/  First Contact Pager: [phone]  weekends / holidays): Second Contact Pager: [phone]   Chief Complaint: weight loss  History of Present Illness:  Frank Hart is a 75 year old male with hypertension, hyperlipidemia, ICM with reduced EF 40-45%, CAD, and tobacco use disorder.  He initially presented to the Emergency Department today for muffled hearing in his right ear. He had been evaluated in an Urgent Care a few days prior for this, at which time they attempted to irrigate his ear, unsuccessfully, and he was prescribed Debrox. CXR in the urgent care showed a lingular infiltrate so he was prescribed doxycycline and Levaquin. Since then, he was having discomfort in his right ear, prompting evaluation in the ED.   While in the ED, he mentioned a 20 pound weight loss and hemoptysis over the past month. Further workup in the ED revealed a WBC of 51,800. D-dimer was mildly elevated and he underwent a CTA which showed a lung mass. Oncology was consulted. Pulmonary was also consulted and recommended admission for inpatient EBUS prior to discharge. IMTS was asked to admit.   On further discussion with me, he again notes the 1 month history of ~20lb weight loss, associated with decreased appetite, hemoptysis, and night sweats. Denies fatigue, shortness of breath, abdominal pain, or change in bowel or urinary habits.   He reports a prior history of gastric cancer s/p resection and history of a partial bowel resection. He believes this occurred about 10-12 years ago but is unable to provide further history into this.  He admits to  smoking around 1/3-1/2 pack of cigarettes daily since he was 16. He reports prior history of heavy alcohol use but now only drinks 1-2 beers per week.   Reports that he just established care with [facility].   Meds:  Current Meds  Medication Sig   atorvastatin (LIPITOR) 40 MG tablet Take 40 mg by mouth daily.   clopidogrel (PLAVIX) 75 MG tablet Take 1 tablet (75 mg total) by mouth daily.   doxycycline (VIBRAMYCIN) 100 MG capsule Take 1 capsule (100 mg total) by mouth 2 (two) times daily for 10 days.   levofloxacin (LEVAQUIN) 500 MG tablet Take 1 tablet (500 mg total) by mouth daily for 7 days.   metFORMIN (GLUCOPHAGE) 1000 MG tablet Take 1 tablet (1,000 mg total) by mouth daily with breakfast.     Allergies: Allergies as of 06/27/2021 - Review Complete 06/27/2021  Allergen Reaction Noted   Penicillins Other (See Comments) 06/22/2021   Asa [aspirin] Other (See Comments) 08/14/2014   Past Medical History:  Diagnosis Date   CAD (coronary artery disease)    cath 10/13/2015 1v dx DES to prox to mid LCx, EF 40-45% by echo   Hyperlipidemia    Hypertension    Ischemic cardiomyopathy    EF 40-45% on echo 10/13/2015 after NSTEMI   Prediabetes    A1C 6.2 on 10/12/2015   Tobacco abuse 10/14/2015  Family History  Problem Relation Age of Onset   Heart attack Father        53     Social History:  Resides in [place]. Lives independently. Divorced, no children.  30 pack year smoking history Drinks 1-2 beers per week Occasional marijuana and intranasal cocaine use  Review of Systems: A complete ROS was negative except as per HPI.   Physical Exam: Blood pressure 118/66, pulse 93, temperature 98.5 F (36.9 C), temperature source Oral, resp. rate (!) 21, SpO2 100 %. General: catchetic appearing gentleman HENT: MMM, head atraumatic Eyes: no scleral icterus or conjunctival injection Cardiac: RRR, no lower extremity edema Pulm: breathing comfortably on room air,  diminished lung sounds throughout GI: linear post surgical scar from sternum to umbilicus, abdomen soft, non-tender Neuro: alert and oriented. No deficit on gross exam. Skin: no rash or lesion on limited exam Psych: normal affect and mood  CBC Latest Ref Rng & Units 06/27/2021 07/14/2017 10/14/2015  WBC 4.0 - 10.5 K/uL 51.8(HH) - 10.1  Hemoglobin 13.0 - 17.0 g/dL 12.1(L) 15.0 13.2  Hematocrit 39.0 - 52.0 % 38.7(L) 44.0 41.3  Platelets 150 - 400 K/uL 362 - 210   BMP Latest Ref Rng & Units 06/27/2021 11/05/2020 07/14/2017  Glucose 70 - 99 mg/dL 190(H) 158(H) 238(H)  BUN 8 - 23 mg/dL 22 13 17   Creatinine 0.61 - 1.24 mg/dL 1.08 1.30(H) 1.30(H)  BUN/Creat Ratio 10 - 24 - 10 -  Sodium 135 - 145 mmol/L 137 141 138  Potassium 3.5 - 5.1 mmol/L 4.6 4.7 4.4  Chloride 98 - 111 mmol/L 103 102 101  CO2 22 - 32 mmol/L 26 25 -  Calcium 8.9 - 10.3 mg/dL 8.9 9.6 -    EKG: sinus rhythm  DG Chest 2 View Result Date: 06/27/2021 IMPRESSION: Airspace disease in the lingula is unchanged since the exam 5 days ago and is likely due to pneumonia. Recommend follow-up plain films to clearing. Aortic Atherosclerosis (ICD10-I70.0) and Emphysema (ICD10-J43.9). Electronically Signed   By: Inge Rise M.D.   On: 06/27/2021 13:39   CT Angio Chest PE W and/or Wo Contrast Result Date: 06/27/2021 FINDINGS: Cardiovascular: There are no filling defects within the pulmonary arteries to suggest pulmonary embolus. Moderate aortic atherosclerosis with calcified and irregular atheromatous plaque. No acute aortic finding mild aortic tortuosity. Common origin of brachiocephalic and left common carotid artery. There are coronary artery calcifications. No pericardial effusion. Mediastinum/Nodes: 8 mm left hilar lymph node, series 6, image 85. There is a 13 mm lower anterior paratracheal node, series 6, image 72. 11 mm right hilar node series 6, image 79. Scattered mediastinal calcifications likely sequela prior granulomatous disease.  No visualized thyroid nodule. No definite esophageal wall thickening. Lungs/Pleura: Partially enhancing masslike opacity in the lingula measures 5.1 x 4.0 x 4.4 cm, series 6, image 106 and series 10, image 50. This abuts the pleura laterally and left heart border medially. Posteriorly there is bulging of the inter lobar fissure. There is a punctate internal calcification. Mild surrounding ground-glass opacity extends along the inter lobar fissure. There are multiple spiculated nodules within both lungs. Spiculated left apical nodule measures 11 mm, series 7, image 18. 7 mm right apical spiculated nodule series 7 image 24. 4 mm subpleural right upper lobe nodule series, 7 image 42. Left upper lobe 11 mm nodule series 7 image 43. 5 mm nodule anterior left upper lobe, series 7, image 57. 7 mm right lower lobe nodule series 7, image 112. Mild to moderate background  emphysema. Mild central bronchial thickening. No pleural fluid. Dependent atelectasis in the lung bases. Upper Abdomen: Assessed on abdominopelvic CT, performed concurrently. Musculoskeletal: No discrete lytic or blastic osseous lesions. Remote posterior left twelfth rib fracture. Generalized paucity of body fat suggest cachexia. Review of the MIP images confirms the above findings.  IMPRESSION: 1. No pulmonary embolus. 2. Partially enhancing masslike opacity in the lingula measuring 5.1 x 4.0 x 4.4 cm, suspicious for primary bronchogenic malignancy. 3. Multiple spiculated nodules within both lungs, largest in the left upper lobe measuring 11 mm. These may be metastatic or additional primaries. 4. Scattered prominent mildly enlarged mediastinal and hilar nodes, nonspecific. 5. Moderate emphysema. 6. Coronary artery calcifications. Aortic Atherosclerosis (ICD10-I70.0) and Emphysema (ICD10-J43.9). Electronically Signed   By: Keith Rake M.D.   On: 06/27/2021 17:44   CT Abdomen Pelvis W Contrast Result Date: 06/27/2021 FINDINGS: Lower chest: Assessed on  concurrent chest CT, reported separately. Partially enhancing masslike opacity in the lingula. Hepatobiliary: No focal hepatic lesion. Gallbladder physiologically distended, no calcified stone. No biliary dilatation. Pancreas: Fullness in the distal body of the pancreas, for example series 6 image 74, without discrete mass. No ductal dilatation or peripancreatic fat stranding. Spleen: Normal in size without focal abnormality. Adrenals/Urinary Tract: No adrenal nodule. No hydronephrosis or perinephric edema. Homogeneous renal enhancement with symmetric excretion on delayed phase imaging. No focal renal lesion. Urinary bladder is physiologically distended without wall thickening. No obvious bladder mass. Stomach/Bowel: Bowel assessment is limited in the absence of enteric contrast and paucity of intra-abdominal fat. Mild wall thickening at the gastroesophageal junction. Surgical clips adjacent to the posterior gastric body. No obvious gastric mass on this unenhanced exam. Prominent fluid-filled loops of small bowel in the right pelvis are nonspecific. No obstruction. The appendix is not definitively visualized. Small volume of colonic stool. Limited assessment for colonic mass. Questionable areas of enhancement in the ascending colon, series 6, image 120. Vascular/Lymphatic: Moderate aorto bi-iliac atherosclerosis. No aortic aneurysm. Paucity of body fat and lack of enteric contrast limits detailed assessment for adenopathy. Allowing for this, there is no evidence of enlarged abdominal, retroperitoneal, or pelvic lymph nodes. Reproductive: Prostate is unremarkable. Other: No free air or ascites. Generalized paucity of intra-abdominal and subcutaneous fat. Musculoskeletal: Intramedullary rod in the right proximal femur. Punctate sclerotic densities in the pelvis are nonspecific in the setting. No bony destruction or obvious lytic lesion.  IMPRESSION: 1. Fullness in the distal body of the pancreas without discrete  mass. This could be further assessed with MRI based on clinical concern. 2. Questionable areas of enhancement in the ascending colon, not well assessed on the current exam. Up-to-date colonoscopy is recommended. 3. No adrenal nodule or adenopathy, or additional findings to suggest abdominopelvic metastatic disease. 4. Punctate sclerotic densities in the pelvis are nonspecific in the setting, but favored to represent bone islands. 5. Generalized paucity of body fat can be seen with cachexia. 6. Partially enhancing masslike opacity in the lingula, assessed on concurrent chest CT, reported separately. Aortic Atherosclerosis (ICD10-I70.0). Electronically Signed   By: Keith Rake M.D.   On: 06/27/2021 17:38     Assessment & Plan by Problem: Principal Problem:   Lung mass Active Problems:   CAD (coronary artery disease), native coronary artery   Tobacco abuse   Anemia   Underweight   Unintentional weight loss   Emphysema/COPD (HCC)   Aortic atherosclerosis (HCC)  Frank Hart is a 75 year old male with a 30 pack year smoking history who presented with hemoptysis,  weight loss, and poor appetite who was found to have a lung mass and WBC of 52. He admitted to IMTS for neoplasm workup.  Lung mass (5.1x4x4.4cm), hemoptysis, neutrophilia High suspicion for neoplasm in the context of hemoptysis, weight loss, tobacco use disorder, and scattered nodules bilaterally. Abdominal CT findings include "fullness" in the head of the pancreas and questionable enhancement in the ascending colon.  WBC 52; hemoglobin  He had been placed on levaquin and doxycycline when seen in urgent care a few days ago based out of concern of for pneumonia based on CXR findings. Given the findings on CTA today, low suspicion for a post obstructive pneumonia so will stop the antibiotics.  Plan Admit to med-surg. Suspect his stay will be greater than 2 midnights as he undergoes further testing Oncology consulted by the ED--will see  in AM Pulmonology consulted by EDP for EBUS--will be seen by pulm over the weekend but will likely need to give a 5d washout period for the plavix so will probably not be able to have EBUS done until early next week. Question if this could be done outpatient. Monitor blood counts Hold VTE prophylaxis in the pre-operative setting Consider MRI for further evaluation of pancreatic findings.  Blood smear Will need referral to GI after discharge for diagnostic colonoscopy for f/u of colon enhancement  Type 2 NSTEMI. Trops 21>54. No active chest pain. No major ischemic EKG changes. Continue to monitor  Elevated D-dimer. CTA negative for pulmonary embolism. Likely elevated in the setting of malagnancy.   Normocytic anemia. Likely in the setting of malignancy.  Check iron panel Malignancy workup at stated above  Underweight. BMI 18.27.  Consult placed to dietician   #Ischemic cardiomyopathy with reduced EF 40-45%. Chronic and stable. Euvolemic on admission #CAD s/p stenting to mid Lcx (2016), Aortic atherosclerosis Continue statin Hold plavix pre-operatively Monitor volume status  #Type 2 diabetes mellitus. A1C 8.1 Continue metformin  #Tobacco use disorder. Encourage cessation  #Emphysema. Noted on CTA chest. Asymptomatic at baseline.   #Cerumen impaction of right ear--resolved after irrigation.  Dispo: Admit patient to Inpatient with expected length of stay greater than 2 midnights.  Mitzi Hansen, MD Internal Medicine Resident PGY-3 [facility] Pager: [phone] 06/27/2021 10:43 PM

## 2021-06-27 NOTE — ED Provider Notes (Addendum)
Emergency Medicine Provider Triage Evaluation Note  Frank Hart , a 75 y.o. male  was evaluated in triage.  Pt complains of multiple complaints. Right ear pain. Feels full. Went to UC. Given abx oral and drops, unable to get wax removed from ear. Also with hemoptysis, cough. Coughing bright red blood, sometimes only tinged. Unintentional weight loss over last 6 months. No known hx of CA. Daily tobacco user  On Doxy and Levaquin  Review of Systems  Positive: Ear pain, hemoptysis, cough Negative: Fever, emesis, abd apin  Physical Exam  BP 133/80 (BP Location: Right Arm)   Pulse (!) 104   Temp 98.4 F (36.9 C) (Oral)   Resp 15   SpO2 100%  Gen:   Awake, no distress   Ear:  Left TM clear, right Tm with impacted cerumen. Non tender mastoids Resp:  Normal effort  MSK:   Moves extremities without difficulty  Other:    Medical Decision Making  Medically screening exam initiated at 12:50 PM.  Appropriate orders placed.  Kayle Correa was informed that the remainder of the evaluation will be completed by another provider, this initial triage assessment does not replace that evaluation, and the importance of remaining in the ED until their evaluation is complete.  Cerumen impaction, hemoptysis  Tachycardic with hemoptysis labs ordered, Bp stable   ADDEND: Contact from lab>>>Patient with leukocytosis at 51.8. Xray here shows possible PNA, similar to UC. Question given hx of weightloss, hemoptysis, tobacco use if CA is involved. CT chest added.      Latrelle Bazar A, PA-C 06/27/21 1253    Johnhenry Tippin A, PA-C 06/27/21 1412    Wyvonnia Dusky, MD 06/27/21 1435

## 2021-06-27 NOTE — ED Notes (Signed)
Patient is going to his car he stated that he will be back

## 2021-06-27 NOTE — ED Notes (Signed)
Pt given [place] sandwich and orange juice per PA.

## 2021-06-28 DIAGNOSIS — R918 Other nonspecific abnormal finding of lung field: Secondary | ICD-10-CM

## 2021-06-28 LAB — CBC
HCT: 37.2 % — ABNORMAL LOW (ref 39.0–52.0)
Hemoglobin: 11.7 g/dL — ABNORMAL LOW (ref 13.0–17.0)
MCH: 26.8 pg (ref 26.0–34.0)
MCHC: 31.5 g/dL (ref 30.0–36.0)
MCV: 85.3 fL (ref 80.0–100.0)
Platelets: 335 10*3/uL (ref 150–400)
RBC: 4.36 MIL/uL (ref 4.22–5.81)
RDW: 13.2 % (ref 11.5–15.5)
WBC: 47.3 10*3/uL — ABNORMAL HIGH (ref 4.0–10.5)
nRBC: 0 % (ref 0.0–0.2)

## 2021-06-28 LAB — TROPONIN I (HIGH SENSITIVITY): Troponin I (High Sensitivity): 54 ng/L — ABNORMAL HIGH (ref <18)

## 2021-06-28 LAB — COMPREHENSIVE METABOLIC PANEL
ALT: 12 U/L (ref 0–44)
AST: 12 U/L — ABNORMAL LOW (ref 15–41)
Albumin: 2.7 g/dL — ABNORMAL LOW (ref 3.5–5.0)
Alkaline Phosphatase: 133 U/L — ABNORMAL HIGH (ref 38–126)
Anion gap: 10 (ref 5–15)
BUN: 11 mg/dL (ref 8–23)
CO2: 24 mmol/L (ref 22–32)
Calcium: 8.5 mg/dL — ABNORMAL LOW (ref 8.9–10.3)
Chloride: 105 mmol/L (ref 98–111)
Creatinine, Ser: 0.98 mg/dL (ref 0.61–1.24)
GFR, Estimated: >60 mL/min (ref >60)
Glucose, Bld: 181 mg/dL — ABNORMAL HIGH (ref 70–99)
Potassium: 4 mmol/L (ref 3.5–5.1)
Sodium: 139 mmol/L (ref 135–145)
Total Bilirubin: 0.5 mg/dL (ref 0.3–1.2)
Total Protein: 6.9 g/dL (ref 6.5–8.1)

## 2021-06-28 LAB — TECHNOLOGIST SMEAR REVIEW

## 2021-06-28 LAB — IRON AND TIBC
Iron: 14 ug/dL — ABNORMAL LOW (ref 45–182)
Saturation Ratios: 6 % — ABNORMAL LOW (ref 17.9–39.5)
TIBC: 244 ug/dL — ABNORMAL LOW (ref 250–450)
UIBC: 230 ug/dL

## 2021-06-28 LAB — FERRITIN: Ferritin: 68 ng/mL (ref 24–336)

## 2021-06-28 NOTE — ED Notes (Signed)
MD saw pt in regards to leaving AMA, with wife at bedside. Pt has been going back and forth about leaving. Attending provider informed this RN to allow pt to leave AMA if he decides he wants to leave again. Pt currently asleep at bedside with wife.

## 2021-06-28 NOTE — ED Notes (Addendum)
Went in to pt's room to medicate and reassess(wife at bedside). Pt was upset about admission status and felt like he was waiting around for nothing. This RN explained to the pt about admission holds and bed assignments. This RN informed pt that his tx would not be delayed due to the department he was in. Pt requesting this RN to take out his IV and to leave. This RN informed the pt about  AMA and went to update the attending for this pt.

## 2021-06-28 NOTE — Discharge Summary (Signed)
Name: Frank Hart MRN: [id number] DOB: 1946/02/13 75 y.o. PCP: [name], DO  Date of Admission: 06/27/2021 12:35 PM Date of Discharge: 06/28/2021 Attending Physician: No att. providers found  Discharge Diagnosis: 1.  Lung mass  Discharge Medications: Allergies as of 06/28/2021       Reactions   Penicillins Other (See Comments)   Syncope   Asa [aspirin] Other (See Comments)   Trigger stomach issues         Medication List     STOP taking these medications    clopidogrel 75 MG tablet Commonly known as: Plavix   doxycycline 100 MG capsule Commonly known as: VIBRAMYCIN   levofloxacin 500 MG tablet Commonly known as: LEVAQUIN       TAKE these medications    atorvastatin 40 MG tablet Commonly known as: Lipitor Take 1 tablet (40 mg total) by mouth daily. What changed: Another medication with the same name was removed. Continue taking this medication, and follow the directions you see here.   metFORMIN 1000 MG tablet Commonly known as: Glucophage Take 1 tablet (1,000 mg total) by mouth daily with breakfast.        Disposition and follow-up:   [name] was discharged from [facility] in [place] condition.  At the hospital follow up visit please address:  1.  Lung mass.  Masslike opacity in the lingula suspicious for primary bronchogenic malignancy and multiple spiculated nodules within both lungs.  Scattered prominent mildly enlarged mediastinal and hilar nodes.  Patient left AGAINST MEDICAL ADVICE prior to evaluation by oncology and pulmonology.  Pulmonology to schedule follow-up for navigational bronchoscopy and biopsy for definitive diagnosis. Oncology to schedule follow-up for treatment of lung mass if malignancy. Patient will need 5-day washout period from plavix prior to bronchoscopy.  2.  Labs / imaging needed at time of follow-up: CBC. CMP  3.  Pending labs/ test needing follow-up: Pathologist smear review  Follow-up  Appointments: -Oncology (Dr. Julien Nordmann) -Pulmonology (Dr. Lamonte Sakai or Dr. [name])  [facility] Course by problem list: 1. Lung Mass Patient presented with hemoptysis, 20 pound weight loss, poor appetite over the past month.  Further work-up in the ED revealed a WBC count of 51,800.  D-dimer was mildly elevated.  Underwent CTA which showed a lung mass in the lingula, suspicious for primary bronchogenic malignancy, along with multiple spiculated nodules within both lungs.  Also showed scattered prominent mildly enlarged mediastinal and hilar nodes.  Abdominal CT abdomen pelvis showing fullness in the distal body of the pancreas and questionable areas of enhancement in the ascending colon.  Oncology was consulted as well as pulmonology.  Pulmonology recommended inpatient admission for EBUS and biopsy prior to discharge.  The next morning after admission, patient left AGAINST MEDICAL ADVICE prior to evaluation by IMTS, oncology, or pulmonology.  Dr. Julien Nordmann, oncology, to schedule outpatient follow-up with patient for further management should lung mass turned out to be malignancy.  Pulmonology to schedule patient for outpatient follow-up and bronchoscopy and biopsy for definitive diagnosis.  Of note patient will need to stop taking Plavix for 5 days for washout period prior to bronchoscopy.  Discharge Exam:   BP 125/80   Pulse 85   Temp 98.5 F (36.9 C) (Oral)   Resp 18   SpO2 98%  Discharge exam: Patient left AGAINST MEDICAL ADVICE before physical exam could be performed.  Pertinent Labs, Studies, and Procedures:  CBC Latest Ref Rng & Units 06/28/2021 06/27/2021 07/14/2017  WBC 4.0 - 10.5 K/uL 47.3(H) 51.8(HH) -  Hemoglobin 13.0 - 17.0 g/dL 11.7(L) 12.1(L) 15.0  Hematocrit 39.0 - 52.0 % 37.2(L) 38.7(L) 44.0  Platelets 150 - 400 K/uL 335 362 -   CMP Latest Ref Rng & Units 06/28/2021 06/27/2021 11/05/2020  Glucose 70 - 99 mg/dL 181(H) 190(H) 158(H)  BUN 8 - 23 mg/dL 11 22 13   Creatinine 0.61 - 1.24 mg/dL 0.98  1.08 1.30(H)  Sodium 135 - 145 mmol/L 139 137 141  Potassium 3.5 - 5.1 mmol/L 4.0 4.6 4.7  Chloride 98 - 111 mmol/L 105 103 102  CO2 22 - 32 mmol/L 24 26 25   Calcium 8.9 - 10.3 mg/dL 8.5(L) 8.9 9.6  Total Protein 6.5 - 8.1 g/dL 6.9 - -  Total Bilirubin 0.3 - 1.2 mg/dL 0.5 - -  Alkaline Phos 38 - 126 U/L 133(H) - -  AST 15 - 41 U/L 12(L) - -  ALT 0 - 44 U/L 12 - -   D dimer - 0.37  Iron/TIBC/Ferritin/ %Sat    Component Value Date/Time   IRON 14 (L) 06/28/2021 0500   TIBC 244 (L) 06/28/2021 0500   FERRITIN 68 06/28/2021 0500   IRONPCTSAT 6 (L) 06/28/2021 0500   DG Chest 2 View  Result Date: 06/27/2021 CLINICAL DATA:  Hemoptysis.  Chronic cough.  Cigarette smoker. EXAM: CHEST - 2 VIEW COMPARISON:  Single-view of the chest 06/22/2021 and 10/11/2015. FINDINGS: The lungs are emphysematous. Airspace disease in the lingula is again seen. The right lung is clear. Heart size is normal. No pneumothorax or pleural fluid. Aortic atherosclerosis noted. IMPRESSION: Airspace disease in the lingula is unchanged since the exam 5 days ago and is likely due to pneumonia. Recommend follow-up plain films to clearing. Aortic Atherosclerosis (ICD10-I70.0) and Emphysema (ICD10-J43.9). Electronically Signed   By: Inge Rise M.D.   On: 06/27/2021 13:39   CT Angio Chest PE W and/or Wo Contrast  Result Date: 06/27/2021 CLINICAL DATA:  PE suspected, high prob hemoptysis, elevated white count, tobacco user, CP, SOB Unintentional weight loss over the last 6 months. EXAM: CT ANGIOGRAPHY CHEST WITH CONTRAST TECHNIQUE: Multidetector CT imaging of the chest was performed using the standard protocol during bolus administration of intravenous contrast. Multiplanar CT image reconstructions and MIPs were obtained to evaluate the vascular anatomy. CONTRAST:  170mL OMNIPAQUE IOHEXOL 350 MG/ML SOLN COMPARISON:  Chest radiograph earlier today FINDINGS: Cardiovascular: There are no filling defects within the pulmonary arteries  to suggest pulmonary embolus. Moderate aortic atherosclerosis with calcified and irregular atheromatous plaque. No acute aortic finding mild aortic tortuosity. Common origin of brachiocephalic and left common carotid artery. There are coronary artery calcifications. No pericardial effusion. Mediastinum/Nodes: 8 mm left hilar lymph node, series 6, image 85. There is a 13 mm lower anterior paratracheal node, series 6, image 72. 11 mm right hilar node series 6, image 79. Scattered mediastinal calcifications likely sequela prior granulomatous disease. No visualized thyroid nodule. No definite esophageal wall thickening. Lungs/Pleura: Partially enhancing masslike opacity in the lingula measures 5.1 x 4.0 x 4.4 cm, series 6, image 106 and series 10, image 50. This abuts the pleura laterally and left heart border medially. Posteriorly there is bulging of the inter lobar fissure. There is a punctate internal calcification. Mild surrounding ground-glass opacity extends along the inter lobar fissure. There are multiple spiculated nodules within both lungs. Spiculated left apical nodule measures 11 mm, series 7, image 18. 7 mm right apical spiculated nodule series 7 image 24. 4 mm subpleural right upper lobe nodule series, 7 image 42. Left upper lobe  11 mm nodule series 7 image 43. 5 mm nodule anterior left upper lobe, series 7, image 57. 7 mm right lower lobe nodule series 7, image 112. Mild to moderate background emphysema. Mild central bronchial thickening. No pleural fluid. Dependent atelectasis in the lung bases. Upper Abdomen: Assessed on abdominopelvic CT, performed concurrently. Musculoskeletal: No discrete lytic or blastic osseous lesions. Remote posterior left twelfth rib fracture. Generalized paucity of body fat suggest cachexia. Review of the MIP images confirms the above findings. IMPRESSION: 1. No pulmonary embolus. 2. Partially enhancing masslike opacity in the lingula measuring 5.1 x 4.0 x 4.4 cm, suspicious  for primary bronchogenic malignancy. 3. Multiple spiculated nodules within both lungs, largest in the left upper lobe measuring 11 mm. These may be metastatic or additional primaries. 4. Scattered prominent mildly enlarged mediastinal and hilar nodes, nonspecific. 5. Moderate emphysema. 6. Coronary artery calcifications. Aortic Atherosclerosis (ICD10-I70.0) and Emphysema (ICD10-J43.9). Electronically Signed   By: Keith Rake M.D.   On: 06/27/2021 17:44   CT Abdomen Pelvis W Contrast  Result Date: 06/27/2021 CLINICAL DATA:  Metastatic disease evaluation. EXAM: CT ABDOMEN AND PELVIS WITH CONTRAST TECHNIQUE: Multidetector CT imaging of the abdomen and pelvis was performed using the standard protocol following bolus administration of intravenous contrast. CONTRAST:  162mL OMNIPAQUE IOHEXOL 350 MG/ML SOLN COMPARISON:  None. FINDINGS: Lower chest: Assessed on concurrent chest CT, reported separately. Partially enhancing masslike opacity in the lingula. Hepatobiliary: No focal hepatic lesion. Gallbladder physiologically distended, no calcified stone. No biliary dilatation. Pancreas: Fullness in the distal body of the pancreas, for example series 6 image 74, without discrete mass. No ductal dilatation or peripancreatic fat stranding. Spleen: Normal in size without focal abnormality. Adrenals/Urinary Tract: No adrenal nodule. No hydronephrosis or perinephric edema. Homogeneous renal enhancement with symmetric excretion on delayed phase imaging. No focal renal lesion. Urinary bladder is physiologically distended without wall thickening. No obvious bladder mass. Stomach/Bowel: Bowel assessment is limited in the absence of enteric contrast and paucity of intra-abdominal fat. Mild wall thickening at the gastroesophageal junction. Surgical clips adjacent to the posterior gastric body. No obvious gastric mass on this unenhanced exam. Prominent fluid-filled loops of small bowel in the right pelvis are nonspecific. No  obstruction. The appendix is not definitively visualized. Small volume of colonic stool. Limited assessment for colonic mass. Questionable areas of enhancement in the ascending colon, series 6, image 120. Vascular/Lymphatic: Moderate aorto bi-iliac atherosclerosis. No aortic aneurysm. Paucity of body fat and lack of enteric contrast limits detailed assessment for adenopathy. Allowing for this, there is no evidence of enlarged abdominal, retroperitoneal, or pelvic lymph nodes. Reproductive: Prostate is unremarkable. Other: No free air or ascites. Generalized paucity of intra-abdominal and subcutaneous fat. Musculoskeletal: Intramedullary rod in the right proximal femur. Punctate sclerotic densities in the pelvis are nonspecific in the setting. No bony destruction or obvious lytic lesion. IMPRESSION: 1. Fullness in the distal body of the pancreas without discrete mass. This could be further assessed with MRI based on clinical concern. 2. Questionable areas of enhancement in the ascending colon, not well assessed on the current exam. Up-to-date colonoscopy is recommended. 3. No adrenal nodule or adenopathy, or additional findings to suggest abdominopelvic metastatic disease. 4. Punctate sclerotic densities in the pelvis are nonspecific in the setting, but favored to represent bone islands. 5. Generalized paucity of body fat can be seen with cachexia. 6. Partially enhancing masslike opacity in the lingula, assessed on concurrent chest CT, reported separately. Aortic Atherosclerosis (ICD10-I70.0). Electronically Signed   By: Keith Rake  M.D.   On: 06/27/2021 17:38      Discharge Instructions: -Patient left AGAINST MEDICAL ADVICE and discharge instructions could not be provided.  Signed: Virl Axe, MD 06/28/2021, 12:25 PM   Pager: [phone]

## 2021-06-28 NOTE — Progress Notes (Signed)
CHART NOTE I came to [facility] this morning to see the patient but it looks like he left the emergency department AMA.  I will forward his information to the thoracic navigator to try to arrange for the patient visit with me on outpatient basis. He definitely needs to see pulmonary medicine first for bronchoscopy and biopsy. I will be available to help this patient and evaluate him again if she is admitted to the hospital.

## 2021-06-28 NOTE — Care Plan (Signed)
Received a message by pt's bedside RN that he requests to leave AMA. I met with pt at bedside.   He wishes to leave for several reasons:  He says "Why am I even here if you guys are not doing anything for me?" I explained the reasoning for the admission and that, although no further workup would be performed tonight, the oncologist and pulmonologists were planning on seeing him in the upcoming days.   He says "I have not even seen a doctor in 3 or 4 hours, so what do I need to be here for". He denies any specific complaints requiring re-evaluation in the interm since I saw him on admission. On admission, I had explained to him at that time that my team would be seeing him in the morning unless anything comes up in the interm, at which time he could let the nursing staff know. I attempted to correct his misconception that he would be seeing a doctor every couple of hours and reiterated that my team would be around in the morning to see him.   He also complains that the stretcher is uncomfortable.  He expressed understanding of the reason for admission and risks of leaving against medical advise. He expressed the choice to leave AMA.  He was encouraged to follow up with his new PCP at [facility] for referrals to the appropriate specialists. He expressed understanding and had no further questions at that time.    Mitzi Hansen, MD Internal Medicine Resident PGY-3 [facility] Pager: [phone] 06/28/2021 12:23 AM

## 2021-06-28 NOTE — ED Notes (Signed)
This pt was seen talking to doctors in the room and then walked to the bathroom across the hall. Pt was seen by the tech taking out his IV and walking out of the emergency department. MD made aware

## 2021-06-29 ENCOUNTER — Encounter: Payer: Self-pay | Admitting: *Deleted

## 2021-06-29 NOTE — Progress Notes (Signed)
Patient seen in ED over weekend.  Dr. Julien Nordmann consulted but patient left AMA before Dr. Julien Nordmann could see patient.  Per Dr. Julien Nordmann, I sent an IB message in follow up to Salvadore Dom, NP who started pulmonary consult to inform him that Dr. Julien Nordmann would be happy to see patient once they complete bronchoscopy and biopsy.

## 2021-06-30 ENCOUNTER — Observation Stay (HOSPITAL_COMMUNITY)
Admission: EM | Admit: 2021-06-30 | Discharge: 2021-07-02 | Disposition: A | Discharge status: Home / Self Care | Payer: Medicare PPO | Attending: Student in an Organized Health Care Education/Training Program | Admitting: Student in an Organized Health Care Education/Training Program

## 2021-06-30 ENCOUNTER — Telehealth: Payer: Self-pay | Admitting: Medical Oncology

## 2021-06-30 DIAGNOSIS — F17210 Nicotine dependence, cigarettes, uncomplicated: Secondary | ICD-10-CM | POA: Diagnosis not present

## 2021-06-30 DIAGNOSIS — J432 Centrilobular emphysema: Secondary | ICD-10-CM | POA: Diagnosis not present

## 2021-06-30 DIAGNOSIS — E119 Type 2 diabetes mellitus without complications: Secondary | ICD-10-CM | POA: Insufficient documentation

## 2021-06-30 DIAGNOSIS — R739 Hyperglycemia, unspecified: Secondary | ICD-10-CM

## 2021-06-30 DIAGNOSIS — J439 Emphysema, unspecified: Secondary | ICD-10-CM | POA: Diagnosis present

## 2021-06-30 DIAGNOSIS — J438 Other emphysema: Secondary | ICD-10-CM | POA: Insufficient documentation

## 2021-06-30 DIAGNOSIS — R634 Abnormal weight loss: Secondary | ICD-10-CM | POA: Diagnosis not present

## 2021-06-30 DIAGNOSIS — I2510 Atherosclerotic heart disease of native coronary artery without angina pectoris: Secondary | ICD-10-CM | POA: Insufficient documentation

## 2021-06-30 DIAGNOSIS — R918 Other nonspecific abnormal finding of lung field: Principal | ICD-10-CM | POA: Insufficient documentation

## 2021-06-30 DIAGNOSIS — Z7984 Long term (current) use of oral hypoglycemic drugs: Secondary | ICD-10-CM | POA: Diagnosis not present

## 2021-06-30 DIAGNOSIS — Z7901 Long term (current) use of anticoagulants: Secondary | ICD-10-CM | POA: Insufficient documentation

## 2021-06-30 DIAGNOSIS — D72829 Elevated white blood cell count, unspecified: Secondary | ICD-10-CM

## 2021-06-30 DIAGNOSIS — F17200 Nicotine dependence, unspecified, uncomplicated: Secondary | ICD-10-CM

## 2021-06-30 DIAGNOSIS — Z419 Encounter for procedure for purposes other than remedying health state, unspecified: Secondary | ICD-10-CM

## 2021-06-30 DIAGNOSIS — I10 Essential (primary) hypertension: Secondary | ICD-10-CM | POA: Insufficient documentation

## 2021-06-30 DIAGNOSIS — E1165 Type 2 diabetes mellitus with hyperglycemia: Secondary | ICD-10-CM | POA: Diagnosis not present

## 2021-06-30 DIAGNOSIS — Z9889 Other specified postprocedural states: Secondary | ICD-10-CM

## 2021-06-30 DIAGNOSIS — D72823 Leukemoid reaction: Secondary | ICD-10-CM | POA: Diagnosis present

## 2021-06-30 DIAGNOSIS — E1169 Type 2 diabetes mellitus with other specified complication: Secondary | ICD-10-CM

## 2021-06-30 LAB — PATHOLOGIST SMEAR REVIEW: Path Review: REACTIVE

## 2021-06-30 LAB — CBC WITH DIFFERENTIAL/PLATELET
Abs Immature Granulocytes: 0.74 10*3/uL — ABNORMAL HIGH (ref 0.00–0.07)
Basophils Absolute: 0.2 10*3/uL — ABNORMAL HIGH (ref 0.0–0.1)
Basophils Relative: 0 %
Eosinophils Absolute: 0 10*3/uL (ref 0.0–0.5)
Eosinophils Relative: 0 %
HCT: 36.7 % — ABNORMAL LOW (ref 39.0–52.0)
Hemoglobin: 11.3 g/dL — ABNORMAL LOW (ref 13.0–17.0)
Immature Granulocytes: 2 %
Lymphocytes Relative: 4 %
Lymphs Abs: 1.6 10*3/uL (ref 0.7–4.0)
MCH: 26.7 pg (ref 26.0–34.0)
MCHC: 30.8 g/dL (ref 30.0–36.0)
MCV: 86.6 fL (ref 80.0–100.0)
Monocytes Absolute: 1.6 10*3/uL — ABNORMAL HIGH (ref 0.1–1.0)
Monocytes Relative: 3 %
Neutro Abs: 42.9 10*3/uL — ABNORMAL HIGH (ref 1.7–7.7)
Neutrophils Relative %: 91 %
Platelets: 335 10*3/uL (ref 150–400)
RBC: 4.24 MIL/uL (ref 4.22–5.81)
RDW: 13.3 % (ref 11.5–15.5)
WBC: 47.1 10*3/uL — ABNORMAL HIGH (ref 4.0–10.5)
nRBC: 0 % (ref 0.0–0.2)

## 2021-06-30 LAB — CBG MONITORING, ED
Glucose-Capillary: 161 mg/dL — ABNORMAL HIGH (ref 70–99)
Glucose-Capillary: 129 mg/dL — ABNORMAL HIGH (ref 70–99)

## 2021-06-30 LAB — BASIC METABOLIC PANEL
Anion gap: 5 (ref 5–15)
BUN: 9 mg/dL (ref 8–23)
CO2: 26 mmol/L (ref 22–32)
Calcium: 8.5 mg/dL — ABNORMAL LOW (ref 8.9–10.3)
Chloride: 103 mmol/L (ref 98–111)
Creatinine, Ser: 1.12 mg/dL (ref 0.61–1.24)
GFR, Estimated: >60 mL/min (ref >60)
Glucose, Bld: 332 mg/dL — ABNORMAL HIGH (ref 70–99)
Potassium: 4.8 mmol/L (ref 3.5–5.1)
Sodium: 134 mmol/L — ABNORMAL LOW (ref 135–145)

## 2021-06-30 LAB — HEMOGLOBIN A1C
Hgb A1c MFr Bld: 9.8 % — ABNORMAL HIGH (ref 4.8–5.6)
Mean Plasma Glucose: 234.56 mg/dL

## 2021-06-30 MED ORDER — ONDANSETRON HCL 4 MG/2ML IJ SOLN: 4.0000 mg | Freq: Four times a day (QID) | INTRAMUSCULAR | Status: DC | PRN

## 2021-06-30 MED ORDER — ACETAMINOPHEN 650 MG RE SUPP: 650.0000 mg | Freq: Four times a day (QID) | RECTAL | Status: DC | PRN

## 2021-06-30 MED ORDER — SENNOSIDES-DOCUSATE SODIUM 8.6-50 MG PO TABS: 1.0000 | ORAL_TABLET | Freq: Every evening | ORAL | Status: DC | PRN

## 2021-06-30 MED ORDER — ONDANSETRON HCL 4 MG PO TABS: 4.0000 mg | ORAL_TABLET | Freq: Four times a day (QID) | ORAL | Status: DC | PRN

## 2021-06-30 MED ORDER — METFORMIN HCL 500 MG PO TABS
1000.0000 mg | ORAL_TABLET | Freq: Every day | ORAL | Status: DC
  Administered 2021-07-01: 1000 mg via ORAL
  Filled 2021-06-30: qty 2

## 2021-06-30 MED ORDER — ENOXAPARIN SODIUM 40 MG/0.4ML IJ SOSY
40.0000 mg | PREFILLED_SYRINGE | INTRAMUSCULAR | Status: DC
  Administered 2021-06-30 – 2021-07-01 (×2): 40 mg via SUBCUTANEOUS
  Filled 2021-06-30 (×2): qty 0.4

## 2021-06-30 MED ORDER — INSULIN ASPART 100 UNIT/ML IJ SOLN
0.0000 [IU] | INTRAMUSCULAR | Status: DC
  Administered 2021-06-30: 1 [IU] via SUBCUTANEOUS
  Administered 2021-06-30: 2 [IU] via SUBCUTANEOUS
  Administered 2021-07-01: 1 [IU] via SUBCUTANEOUS
  Administered 2021-07-01 (×2): 3 [IU] via SUBCUTANEOUS

## 2021-06-30 MED ORDER — BOOST / RESOURCE BREEZE PO LIQD CUSTOM
1.0000 | Freq: Three times a day (TID) | ORAL | Status: DC
  Administered 2021-06-30 – 2021-07-01 (×3): 1 via ORAL
  Filled 2021-06-30: qty 1

## 2021-06-30 MED ORDER — ATORVASTATIN CALCIUM 40 MG PO TABS
40.0000 mg | ORAL_TABLET | Freq: Every day | ORAL | Status: DC
  Administered 2021-06-30 – 2021-07-01 (×2): 40 mg via ORAL
  Filled 2021-06-30 (×2): qty 1

## 2021-06-30 MED ORDER — SODIUM CHLORIDE 0.9% FLUSH
3.0000 mL | Freq: Two times a day (BID) | INTRAVENOUS | Status: DC
  Administered 2021-07-01 (×2): 3 mL via INTRAVENOUS

## 2021-06-30 MED ORDER — ACETAMINOPHEN 325 MG PO TABS: 650.0000 mg | ORAL_TABLET | Freq: Four times a day (QID) | ORAL | Status: DC | PRN

## 2021-06-30 NOTE — ED Notes (Signed)
Facilities called re remote failure; will be by to check on it.

## 2021-06-30 NOTE — ED Triage Notes (Signed)
Pt back to the ED after leaving [facility]a a few days ago , pt was too be seen by Oncology for a mass and possible biopsy but left
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
## 2021-06-30 NOTE — H&P (Addendum)
Date: 06/30/2021               Patient Name:  Frank Hart MRN: [id number]  DOB: 11/25/1946 Age / Sex: 75 y.o., male   PCP: [name], DO              Medical Service: Internal Medicine Teaching Service              Attending Physician: Dr. [name], Mallie Mussel, *    First Contact: [place], [place] [address] Pager: [phone]  Second Contact: Dr. Lajean Manes Pager: [phone]  Third Contact Dr. Harvie Heck Pager: [phone]       After Hours (After 5p/  First Contact Pager: [phone]  weekends / holidays): Second Contact Pager: [phone]   Chief Complaint: Hemoptysis  History of Present Illness:   Frank Hart is a 75 y/o person with CAD, HLD, HTN, ischemic cardiomyopathy and tobacco abuse who presents with one month of hemoptysis.  Frank Hart notes that he developed a cough productive of clear phlegm with flecks of blood one month ago. He also endorses slowly developing dyspnea on exertion, which he notices while mowing his grass. Frank Hart denies ever producing any green or yellow phlegm, denies SOB at rest, pleuritic chest pain, and fevers.  Frank Hart also endorses 20 pounds of weight loss also in the last month. He notes that his appetite has decreased and his sense of taste has become more dull. He denies any abdominal pain or sense of fullness limiting his appetite. In the last month he has not eaten any whole meals in a day, but rather "eats piddly stuff." His life partner has noticed Frank Smyers's weight loss, pointing out that she can see his bones very easily when he takes his shirt off. Frank Hart denies any blood in the urine or changes in his urinary habits, and denies any blood in his stools or changes in his bowel habits.  Frank Hart also notes that he had loss of hearing in his right ear for a few weeks but that the ED physician today pulled out a cerumen plug while he was here, with immediate resolution of his hearing.  Frank Hart presented to the ED on 06/22/21 for the above concerns, at  which time a CXR revealed concern of a lingular pneumonia vs mass. He was prescribed Doxycycline and Levquin with a plan to f/u with his PCP for likely CT imaging. He has continued on those abx daily. He then presented on 06/27/21 mostly with concern for the aforementioned right hearing loss, but also obtained a CTA Chest and CT A/P, discussed further below. The pt was planned to be admitted on 7/30 with Pulm and Hem/Onc consults but ended up leaving the ED before returning today.  Frank Hart denies headaches, acute changes in his vision, skin lumps or bumps, palpitations, rashes, new fatigue, or any other symptoms. He notes he "feels as good as [he] ever has".  Meds:  PO 75mg  Plavix daily PO 40mg  Lipitor daily PO 1000mg  Metformin daily No other OTCs or supplements.   Allergies: Allergies as of 06/30/2021 - Review Complete 06/30/2021  Allergen Reaction Noted   Penicillins Other (See Comments) 06/22/2021   Asa [aspirin] Other (See Comments) 08/14/2014   Past Medical History:  Diagnosis Date   CAD (coronary artery disease)    cath 10/13/2015 1v dx DES to prox to mid LCx, EF 40-45% by echo   Hyperlipidemia    Hypertension    Ischemic cardiomyopathy    EF  40-45% on echo 10/13/2015 after NSTEMI   Prediabetes    A1C 6.2 on 10/12/2015   Tobacco abuse 10/14/2015   Past Surgical: Partial colectomy reported by pt, around 2012-2014 "for a benign cancer."  Family History:  Mother healthy but deceased. Father w/ CVA vs MI (pt unsure) in older age, and deceased. Pt w/ multiple siblings but does not keep in touch and is unfamiliar with their medical history but does know that one sister had some kind of cancer.  Social History:  Lives at home with his life-long partner.  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  Has smoked cigarettes 1/3 ppd x 68 years. Has consumed 1-2 beers nightly x 68 years. Sporadically smokes marijuana  and uses cocaine, but denies ever using an IV drug. Pt performs all ADLs and IADLs. Enjoys working outside and keeps to himself from a [employer]. Seems active and highly functional at baseline. Would appreciate his niece being involved in his care.  Review of Systems: A complete ROS was negative except as per HPI.  Physical Exam: Blood pressure (!) 103/54, pulse 83, temperature 98.5 F (36.9 C), temperature source Oral, resp. rate 18, SpO2 98 %. General: Cachetic, pleasant, well-appearing elderly gentleman laying comfortably on his side in bed. No acute distress. Head: Normocephalic. Atraumatic. CV: RRR. No murmurs, rubs, or gallops. No LE edema Pulmonary: Lungs CTAB. Normal effort. No wheezing or rales. Abdominal: Soft, nontender, nondistended. Normal bowel sounds. Extremities: Palpable radial and DP pulses. Normal ROM. Skin: Warm and dry. No obvious rash or lesions. Neuro: A&Ox3. Moves all extremities. Normal sensation. No focal deficit. Psych: Normal mood and affect   06/30/21 EKG: not yet released, but 06/27/21 ED visit EKG showed irregular tachycardia with narrow QRS complexes, no signs of ischemia.   06/27/21 CXR: personally reviewed my interpretation is there is airspace disease in the lingula. Heart size is normal. No pleural effusion.  Assessment & Plan by Problem: Active Problems:   Lung mass  Lung mass 06/27/21 CTA Chest revealed a partially enhancing masslike opacity in the lingula 5.1 x 4.0 x 4.4cm, suspicious for primary bronchogenic malignancy in addition to multiple nodules up to 1cm in b/l lungs. Pt is cachectic with DOE, hemoptysis, and weight loss for one month, in broader context of 68 year smoking history. In light of recent AMA from ED, provided frank discussion of clinical concern at this time; pt appreciative, very reasonable, and glad to receiver all recommended care. Dr. Earlie Server with Med Onc aware of pt. -Consulted Pulm for bronchoscopy tomorrow  morning  Type 2 Diabetes mellitus 11/05/20 A1C at 8.1. Glucose today at 332 on admission. On 1000mg  Metformin daily. 06/27/21 CT A/P generally benign but did note fullness in the distal body of the pancreas. In clinical context of high suspicion of malignancy, will bear this finding in mind with work up, though lung primary is currently favored. -SSI during admission   Unexpected weight loss 20 pounds of unexpected weight loss in the past month from a slender body habitus at baseline. Appears cachectic. -Boost/Ensure   Dispo: Admit patient to Observation with expected length of stay less than 2 midnights.  Signed: London Pepper, Medical Student 06/30/2021, 3:06 PM  Pager: [phone]   Attestation for Student Documentation:  I personally was present and performed or re-performed the history, physical exam and medical decision-making activities of this service and have verified that the service and findings are accurately documented  in the student's note.  Lajean Manes, MD 06/30/2021, 4:31 PM

## 2021-06-30 NOTE — Telephone Encounter (Signed)
Pt presented back to ED today after walking out yesterday AMA.  Per Julien Nordmann , I told [place] that pt needs to see pulmonary and he is not on call.

## 2021-06-30 NOTE — Progress Notes (Signed)
PCCM:  Navigational bronchoscopy planned for Thursday 07/02/2021 NPO Wednesday at midnight  [facility] 06/30/2021 6:28 PM

## 2021-06-30 NOTE — ED Notes (Signed)
Pt stepped outside.  

## 2021-07-01 ENCOUNTER — Observation Stay (HOSPITAL_COMMUNITY): Payer: Medicare PPO

## 2021-07-01 DIAGNOSIS — C3490 Malignant neoplasm of unspecified part of unspecified bronchus or lung: Secondary | ICD-10-CM | POA: Diagnosis not present

## 2021-07-01 DIAGNOSIS — R918 Other nonspecific abnormal finding of lung field: Secondary | ICD-10-CM | POA: Diagnosis not present

## 2021-07-01 DIAGNOSIS — D72823 Leukemoid reaction: Secondary | ICD-10-CM | POA: Diagnosis present

## 2021-07-01 DIAGNOSIS — I6782 Cerebral ischemia: Secondary | ICD-10-CM | POA: Diagnosis not present

## 2021-07-01 LAB — BASIC METABOLIC PANEL
Anion gap: 7 (ref 5–15)
BUN: 11 mg/dL (ref 8–23)
CO2: 21 mmol/L — ABNORMAL LOW (ref 22–32)
Calcium: 8.2 mg/dL — ABNORMAL LOW (ref 8.9–10.3)
Chloride: 109 mmol/L (ref 98–111)
Creatinine, Ser: 0.93 mg/dL (ref 0.61–1.24)
GFR, Estimated: >60 mL/min (ref >60)
Glucose, Bld: 122 mg/dL — ABNORMAL HIGH (ref 70–99)
Potassium: 3.7 mmol/L (ref 3.5–5.1)
Sodium: 137 mmol/L (ref 135–145)

## 2021-07-01 LAB — CBG MONITORING, ED
Glucose-Capillary: 102 mg/dL — ABNORMAL HIGH (ref 70–99)
Glucose-Capillary: 133 mg/dL — ABNORMAL HIGH (ref 70–99)
Glucose-Capillary: 209 mg/dL — ABNORMAL HIGH (ref 70–99)
Glucose-Capillary: 212 mg/dL — ABNORMAL HIGH (ref 70–99)

## 2021-07-01 LAB — CBC
HCT: 35.7 % — ABNORMAL LOW (ref 39.0–52.0)
Hemoglobin: 11 g/dL — ABNORMAL LOW (ref 13.0–17.0)
MCH: 26.4 pg (ref 26.0–34.0)
MCHC: 30.8 g/dL (ref 30.0–36.0)
MCV: 85.8 fL (ref 80.0–100.0)
Platelets: 332 10*3/uL (ref 150–400)
RBC: 4.16 MIL/uL — ABNORMAL LOW (ref 4.22–5.81)
RDW: 13.2 % (ref 11.5–15.5)
WBC: 46.3 10*3/uL — ABNORMAL HIGH (ref 4.0–10.5)
nRBC: 0 % (ref 0.0–0.2)

## 2021-07-01 LAB — GLUCOSE, CAPILLARY
Glucose-Capillary: 255 mg/dL — ABNORMAL HIGH (ref 70–99)
Glucose-Capillary: 106 mg/dL — ABNORMAL HIGH (ref 70–99)

## 2021-07-01 LAB — PROTIME-INR
INR: 1.2 (ref 0.8–1.2)
Prothrombin Time: 14.7 s (ref 11.4–15.2)

## 2021-07-01 LAB — TSH: TSH: 3.371 u[IU]/mL (ref 0.350–4.500)

## 2021-07-01 MED ORDER — INSULIN ASPART 100 UNIT/ML IJ SOLN: 0.0000 [IU] | Freq: Four times a day (QID) | INTRAMUSCULAR | Status: DC

## 2021-07-01 MED ORDER — INSULIN ASPART 100 UNIT/ML IJ SOLN
0.0000 [IU] | Freq: Three times a day (TID) | INTRAMUSCULAR | Status: DC
  Administered 2021-07-01: 5 [IU] via SUBCUTANEOUS
  Administered 2021-07-02: 2 [IU] via SUBCUTANEOUS

## 2021-07-01 MED ORDER — GADOBUTROL 1 MMOL/ML IV SOLN
5.0000 mL | Freq: Once | INTRAVENOUS | Status: AC | PRN
  Administered 2021-07-01: 5 mL via INTRAVENOUS

## 2021-07-01 MED ORDER — ALUM & MAG HYDROXIDE-SIMETH 200-200-20 MG/5ML PO SUSP
30.0000 mL | ORAL | Status: DC | PRN
  Administered 2021-07-01: 30 mL via ORAL
  Filled 2021-07-01: qty 30

## 2021-07-01 NOTE — Progress Notes (Signed)
NAME:  Frank Hart, MRN:  [id number], DOB:  [birth year], LOS: 0 ADMISSION DATE:  06/30/2021, CONSULTATION DATE:  07/01/2021  REFERRING MD: Dr. [name] CHIEF COMPLAINT: Hemoptysis, lung mass  History of present illness   Frank Hart is a 75 y.o. male with PMHx HTN, HLD, 1v-CAD, Ischemic, tobacco abuse and cardiomyopathy with EF 40-45% who presents with one month of hemoptysis and unintentional weight loss.  Patient described hemoptysis flecks of blood red blood in the phlegm about half a teaspoon intermittently in the past months.  He has had associated unintentional weight loss of 20 pounds in the last month.  He endorses occasional night sweats and loss of appetite but denies any fever, shortness of breath, chest pain, abdominal pain, leg pain, hematuria, dysuria or bloody stools.   Of note, patient has had 2 visits to the ED in the last 2 weeks for multiple complaints including hearing loss in the right ear hemoptysis and weight loss. CXR on 06/22/2021 was concerning for lingular pneumonia versus mass.  Patient sent home on doxycycline and Levaquin.  He returned to the ED on 06/27/21 and a CTA chest showed partially enhancing masslike opacity suspicious for primary bronchogenic malignancy in addition to multiple nodules in both lungs.  Pulmonology consulted for evaluation of lung mass.  Past Medical History  He,  has a past medical history of CAD (coronary artery disease), Hyperlipidemia, Hypertension, Ischemic cardiomyopathy, Prediabetes, and Tobacco abuse (10/14/2015).   Consults:  Pulmonology  Procedures:  N/A  Significant Diagnostic Tests:  CXR 7/30: Airspace disease in the lingula CTA Chest 7/30: partially enhancing masslike opacity in the lingula measuring 5.1 x 4.0 x 4.4 cm Micro Data:  Negative SARS coronavirus 2 Negative MRSA by PCR  Antimicrobials:  N/A  Objective   Blood pressure 124/87, pulse 99, temperature 98.7 F (37.1 C), resp. rate (!) 21, height 5\' 11"  (1.803 m),  weight 59 kg, SpO2 99 %.       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  I discussed risks, benefits and alternatives The patient is agreeable to proceed.  He does need a brain MRI to complete staging  Outpatient PET will need to be scheduled  NPO at midnight.  RAB scheduled for tomorrow.   I will setup oncology follow up after discharge    Labs   CBC: Recent Labs  Lab 06/27/21 1300 06/28/21 0500 06/30/21 1212 07/01/21 0427  WBC 51.8* 47.3* 47.1* 46.3*  NEUTROABS 46.9*  --  42.9*  --   HGB 12.1* 11.7* 11.3* 11.0*  HCT 38.7* 37.2* 36.7* 35.7*  MCV 86.6 85.3 86.6 85.8  PLT 362 335 335 470    Basic Metabolic Panel: Recent Labs  Lab 06/27/21 1300 06/28/21 0500 06/30/21 1212 07/01/21 0427  NA 137 139 134* 137  K 4.6 4.0 4.8 3.7  CL 103 105  103 109  CO2 26 24 26  21*  GLUCOSE 190* 181* 332* 122*  BUN 22 11 9 11   CREATININE 1.08 0.98 1.12 0.93  CALCIUM 8.9 8.5* 8.5* 8.2*   GFR: Estimated Creatinine Clearance: 57.3 mL/min (by C-G formula based on SCr of 0.93 mg/dL). Recent Labs  Lab 06/27/21 1300 06/28/21 0500 06/30/21 1212 07/01/21 0427  WBC 51.8* 47.3* 47.1* 46.3*    Liver Function Tests: Recent Labs  Lab 06/28/21 0500  AST 12*  ALT 12  ALKPHOS 133*  BILITOT 0.5  PROT 6.9  ALBUMIN 2.7*   No results for input(s): LIPASE,  AMYLASE in the last 168 hours. No results for input(s): AMMONIA in the last 168 hours.  ABG    Component Value Date/Time   TCO2 27 07/14/2017 1424     Coagulation Profile: Recent Labs  Lab 07/01/21 0427  INR 1.2    Cardiac Enzymes: No results for input(s): CKTOTAL, CKMB, CKMBINDEX, TROPONINI in the last 168 hours.  HbA1C: Hemoglobin A1C  Date/Time Value Ref Range Status  11/05/2020 03:03 PM 8.1 (A) 4.0 - 5.6 % Final   Hgb A1c MFr Bld  Date/Time Value Ref Range Status  06/30/2021 12:12 PM 9.8 (H) 4.8 - 5.6 % Final    Comment:    (NOTE) Pre diabetes:          5.7%-6.4%  Diabetes:              >6.4%  Glycemic control for   <7.0% adults with diabetes   10/12/2015 03:51 AM 6.2 (H) 4.8 - 5.6 % Final    Comment:    (NOTE)         Pre-diabetes: 5.7 - 6.4         Diabetes: >6.4         Glycemic control for adults with diabetes: <7.0     CBG: Recent Labs  Lab 06/30/21 1711 06/30/21 2047 07/01/21 0003 07/01/21 0405 07/01/21 0844  GLUCAP 161* 129* 209* 133* 212*     Garner Nash, DO [facility] 07/01/2021 11:33 AM

## 2021-07-01 NOTE — ED Notes (Signed)
Patient transported to MRI 

## 2021-07-01 NOTE — Progress Notes (Addendum)
Subjective:  Frank Hart is a 75 y/o person with CAD, HLD, T2DM, ischemic cardiomyopathy and a 35 year smoking history admitted for hemoptysis and 20 pounds weight loss in one month with CT evidence of mass-like opacity in the lingula.  Frank Hart is joined by his life-long partner Frank Hart at bedside. Frank Hart that he continues to feel quite well. He coughed a little overnight but didn't observe any blood in his clear phlegm. He completed 8-9 days of Doxycycline and Levaquin prior to this admission. He has not had any fevers, chills, drenching night sweats, chest pain or pleurisy.  He consumed about 1/2 of his Ensure that he was offered last night.  Objective:  Vital signs in last 24 hours: Vitals:   07/01/21 0353 07/01/21 0500 07/01/21 0600 07/01/21 0800  BP: 112/63 103/68 111/66 124/87  Pulse: 89 69 90 99  Resp: 19 17  (!) 21  Temp:      TempSrc:      SpO2: 93% 99% 99% 99%  Weight:      Height:       Weight change:  No intake or output data in the 24 hours ending 07/01/21 1118 General: Pleasant, cachetic-appearing gentleman laying comfortably in bed. No acute distress. CV: RRR. No murmurs, rubs, or gallops. No LE edema Pulmonary: Lungs CTAB. Normal effort. No wheezing or rales. Extremities: Palpable radial and DP pulses. Normal ROM. Skin: Warm and dry. No obvious rash or lesions. Neuro: A&Ox3. Moves all extremities. Psych: Normal mood and affect    Assessment/Plan:  Principal Problem:   Lung mass Active Problems:   Essential hypertension   CAD (coronary artery disease), native coronary artery   T2DM (type 2 diabetes mellitus) (HCC)   Emphysema/COPD ([facility])   Neutrophilic leukemoid reaction  Lung mass 06/27/21 CTA Chest revealed a partially enhancing masslike opacity in the lingula 5.1 x 4.0 x 4.4cm suspicious for primary bronchogenic malignancy in addition to multiple nodules up to 1cm in b/l lungs. Pt is cachectic with DOE, hemoptysis and 20 pound weight loss in  one month, in broader context of 58 year smoking history. Pt aware of current concern for malignancy and appreciates all care he is receiving. -Pulm bronchoscopy w/ biopsy tomorrow morning with Frank Hart -Pending results, will reach out to Frank Julien Nordmann in Med Onc  Neutrophilia WBC on admission at 47.1k with PMNs at 42.9k. 7/25 and 7/30 CXRs showing unchanged airspace disease in the lingula (as above) while receiving 8 days of 100mg  Doxy and 50mg  Levaquin from 7/25 to 8/1. Pt has remained afebrile and clinically appears very well aside from cachexia, making an obstructive pneumonia felt less likely. Without PET or BM bx evaluation yet, can't rule out bone mets causing neutrophilia vs solid-tumor associated non-specific inflammatory neutrophilia consistent with pt's normocytic anemia (MCV 85.8, Hgb 11.0). Pt has not received steroids recently.  T2DM 06/30/21 A1C at 9.8. Glucose at 322 on admission. On 1000mg  Metformin daily as outpatient. 06/27/21 A/P generally benign but did not fullness in pancreatic distal body. Will bear this in mind with work up, though lung primary is currently favored. SSI of even greater benefit for this cachectic pt. -Continue 1000mg  Metformin  Unexpected weight loss 20 pounds of unexpected weight loss in the past month from a slender body habitus at baseline. Appears cachectic. -Boost nutritional supplements TID   Prior to admission living arrangement: his home Anticipated discharge location: his home Barriers to discharge: plan for bronch tomorrow am Dispo: 1-2 days   Kamori Barbier,  Jalal Rauch A, Medical Student 07/01/2021, 11:18 AM

## 2021-07-01 NOTE — H&P (View-Only) (Signed)
NAME:  Frank Hart, MRN:  [id number], DOB:  January 03, 1946, LOS: 0 ADMISSION DATE:  06/30/2021, CONSULTATION DATE:  07/01/2021  REFERRING MD: Dr. [name] CHIEF COMPLAINT: Hemoptysis, lung mass  History of present illness   Frank Hart is a 75 y.o. male with PMHx HTN, HLD, 1v-CAD, Ischemic, tobacco abuse and cardiomyopathy with EF 40-45% who presents with one month of hemoptysis and unintentional weight loss.  Patient described hemoptysis flecks of blood red blood in the phlegm about half a teaspoon intermittently in the past months.  He has had associated unintentional weight loss of 20 pounds in the last month.  He endorses occasional night sweats and loss of appetite but denies any fever, shortness of breath, chest pain, abdominal pain, leg pain, hematuria, dysuria or bloody stools.   Of note, patient has had 2 visits to the ED in the last 2 weeks for multiple complaints including hearing loss in the right ear hemoptysis and weight loss. CXR on 06/22/2021 was concerning for lingular pneumonia versus mass.  Patient sent home on doxycycline and Levaquin.  He returned to the ED on 06/27/21 and a CTA chest showed partially enhancing masslike opacity suspicious for primary bronchogenic malignancy in addition to multiple nodules in both lungs.  Pulmonology consulted for evaluation of lung mass.  Past Medical History  He,  has a past medical history of CAD (coronary artery disease), Hyperlipidemia, Hypertension, Ischemic cardiomyopathy, Prediabetes, and Tobacco abuse (10/14/2015).   Consults:  Pulmonology  Procedures:  N/A  Significant Diagnostic Tests:  CXR 7/30: Airspace disease in the lingula CTA Chest 7/30: partially enhancing masslike opacity in the lingula measuring 5.1 x 4.0 x 4.4 cm Micro Data:  Negative SARS coronavirus 2 Negative MRSA by PCR  Antimicrobials:  N/A  Objective   Blood pressure 124/87, pulse 99, temperature 98.7 F (37.1 C), resp. rate (!) 21, height 5\' 11"  (1.803 m),  weight 59 kg, SpO2 99 %.       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  I discussed risks, benefits and alternatives The patient is agreeable to proceed.  He does need a brain MRI to complete staging  Outpatient PET will need to be scheduled  NPO at midnight.  RAB scheduled for tomorrow.   I will setup oncology follow up after discharge    Labs   CBC: Recent Labs  Lab 06/27/21 1300 06/28/21 0500 06/30/21 1212 07/01/21 0427  WBC 51.8* 47.3* 47.1* 46.3*  NEUTROABS 46.9*  --  42.9*  --   HGB 12.1* 11.7* 11.3* 11.0*  HCT 38.7* 37.2* 36.7* 35.7*  MCV 86.6 85.3 86.6 85.8  PLT 362 335 335 409    Basic Metabolic Panel: Recent Labs  Lab 06/27/21 1300 06/28/21 0500 06/30/21 1212 07/01/21 0427  NA 137 139 134* 137  K 4.6 4.0 4.8 3.7  CL 103 105  103 109  CO2 26 24 26  21*  GLUCOSE 190* 181* 332* 122*  BUN 22 11 9 11   CREATININE 1.08 0.98 1.12 0.93  CALCIUM 8.9 8.5* 8.5* 8.2*   GFR: Estimated Creatinine Clearance: 57.3 mL/min (by C-G formula based on SCr of 0.93 mg/dL). Recent Labs  Lab 06/27/21 1300 06/28/21 0500 06/30/21 1212 07/01/21 0427  WBC 51.8* 47.3* 47.1* 46.3*    Liver Function Tests: Recent Labs  Lab 06/28/21 0500  AST 12*  ALT 12  ALKPHOS 133*  BILITOT 0.5  PROT 6.9  ALBUMIN 2.7*   No results for input(s): LIPASE,  AMYLASE in the last 168 hours. No results for input(s): AMMONIA in the last 168 hours.  ABG    Component Value Date/Time   TCO2 27 07/14/2017 1424     Coagulation Profile: Recent Labs  Lab 07/01/21 0427  INR 1.2    Cardiac Enzymes: No results for input(s): CKTOTAL, CKMB, CKMBINDEX, TROPONINI in the last 168 hours.  HbA1C: Hemoglobin A1C  Date/Time Value Ref Range Status  11/05/2020 03:03 PM 8.1 (A) 4.0 - 5.6 % Final   Hgb A1c MFr Bld  Date/Time Value Ref Range Status  06/30/2021 12:12 PM 9.8 (H) 4.8 - 5.6 % Final    Comment:    (NOTE) Pre diabetes:          5.7%-6.4%  Diabetes:              >6.4%  Glycemic control for   <7.0% adults with diabetes   10/12/2015 03:51 AM 6.2 (H) 4.8 - 5.6 % Final    Comment:    (NOTE)         Pre-diabetes: 5.7 - 6.4         Diabetes: >6.4         Glycemic control for adults with diabetes: <7.0     CBG: Recent Labs  Lab 06/30/21 1711 06/30/21 2047 07/01/21 0003 07/01/21 0405 07/01/21 0844  GLUCAP 161* 129* 209* 133* 212*     Garner Nash, DO [facility] 07/01/2021 11:33 AM

## 2021-07-02 ENCOUNTER — Inpatient Hospital Stay (HOSPITAL_COMMUNITY): Payer: Medicare PPO

## 2021-07-02 ENCOUNTER — Encounter (HOSPITAL_COMMUNITY): Payer: Self-pay | Admitting: Student in an Organized Health Care Education/Training Program

## 2021-07-02 ENCOUNTER — Other Ambulatory Visit: Payer: Self-pay

## 2021-07-02 ENCOUNTER — Encounter (HOSPITAL_COMMUNITY)
Admission: EM | Disposition: A | Discharge status: Home / Self Care | Payer: Self-pay | Source: Home / Self Care | Attending: Emergency Medicine

## 2021-07-02 ENCOUNTER — Inpatient Hospital Stay (HOSPITAL_COMMUNITY): Payer: Medicare PPO | Admitting: Certified Registered Nurse Anesthetist

## 2021-07-02 DIAGNOSIS — F17210 Nicotine dependence, cigarettes, uncomplicated: Secondary | ICD-10-CM | POA: Diagnosis not present

## 2021-07-02 DIAGNOSIS — R918 Other nonspecific abnormal finding of lung field: Secondary | ICD-10-CM

## 2021-07-02 DIAGNOSIS — E119 Type 2 diabetes mellitus without complications: Secondary | ICD-10-CM | POA: Diagnosis not present

## 2021-07-02 DIAGNOSIS — I110 Hypertensive heart disease with heart failure: Secondary | ICD-10-CM | POA: Diagnosis not present

## 2021-07-02 DIAGNOSIS — I509 Heart failure, unspecified: Secondary | ICD-10-CM | POA: Diagnosis not present

## 2021-07-02 DIAGNOSIS — Z7901 Long term (current) use of anticoagulants: Secondary | ICD-10-CM | POA: Diagnosis not present

## 2021-07-02 DIAGNOSIS — I2510 Atherosclerotic heart disease of native coronary artery without angina pectoris: Secondary | ICD-10-CM | POA: Diagnosis not present

## 2021-07-02 DIAGNOSIS — D630 Anemia in neoplastic disease: Secondary | ICD-10-CM | POA: Diagnosis not present

## 2021-07-02 DIAGNOSIS — R911 Solitary pulmonary nodule: Secondary | ICD-10-CM | POA: Diagnosis not present

## 2021-07-02 DIAGNOSIS — J438 Other emphysema: Secondary | ICD-10-CM | POA: Diagnosis not present

## 2021-07-02 DIAGNOSIS — I10 Essential (primary) hypertension: Secondary | ICD-10-CM | POA: Diagnosis not present

## 2021-07-02 DIAGNOSIS — Z7984 Long term (current) use of oral hypoglycemic drugs: Secondary | ICD-10-CM | POA: Diagnosis not present

## 2021-07-02 DIAGNOSIS — R634 Abnormal weight loss: Secondary | ICD-10-CM | POA: Diagnosis not present

## 2021-07-02 HISTORY — PX: BRONCHIAL WASHINGS: SHX5105

## 2021-07-02 HISTORY — PX: VIDEO BRONCHOSCOPY WITH ENDOBRONCHIAL NAVIGATION: SHX6175

## 2021-07-02 HISTORY — PX: BRONCHIAL NEEDLE ASPIRATION BIOPSY: SHX5106

## 2021-07-02 HISTORY — PX: VIDEO BRONCHOSCOPY WITH RADIAL ENDOBRONCHIAL ULTRASOUND: SHX6849

## 2021-07-02 HISTORY — PX: BRONCHIAL BIOPSY: SHX5109

## 2021-07-02 HISTORY — PX: BRONCHIAL BRUSHINGS: SHX5108

## 2021-07-02 LAB — CBC
HCT: 36.5 % — ABNORMAL LOW (ref 39.0–52.0)
Hemoglobin: 11.9 g/dL — ABNORMAL LOW (ref 13.0–17.0)
MCH: 27.2 pg (ref 26.0–34.0)
MCHC: 32.6 g/dL (ref 30.0–36.0)
MCV: 83.3 fL (ref 80.0–100.0)
Platelets: 334 10*3/uL (ref 150–400)
RBC: 4.38 MIL/uL (ref 4.22–5.81)
RDW: 13.2 % (ref 11.5–15.5)
WBC: 47.3 10*3/uL — ABNORMAL HIGH (ref 4.0–10.5)
nRBC: 0 % (ref 0.0–0.2)

## 2021-07-02 LAB — GLUCOSE, CAPILLARY
Glucose-Capillary: 151 mg/dL — ABNORMAL HIGH (ref 70–99)
Glucose-Capillary: 146 mg/dL — ABNORMAL HIGH (ref 70–99)
Glucose-Capillary: 185 mg/dL — ABNORMAL HIGH (ref 70–99)
Glucose-Capillary: 447 mg/dL — ABNORMAL HIGH (ref 70–99)
Glucose-Capillary: 449 mg/dL — ABNORMAL HIGH (ref 70–99)
Glucose-Capillary: 377 mg/dL — ABNORMAL HIGH (ref 70–99)

## 2021-07-02 SURGERY — VIDEO BRONCHOSCOPY WITH ENDOBRONCHIAL NAVIGATION
Anesthesia: General | Laterality: Left

## 2021-07-02 MED ORDER — CHLORHEXIDINE GLUCONATE 0.12 % MT SOLN: 15.0000 mL | Freq: Once | OROMUCOSAL | Status: AC

## 2021-07-02 MED ORDER — PROPOFOL 10 MG/ML IV BOLUS
INTRAVENOUS | Status: DC | PRN
  Administered 2021-07-02: 80 mg via INTRAVENOUS

## 2021-07-02 MED ORDER — METFORMIN HCL 1000 MG PO TABS
1000.0000 mg | ORAL_TABLET | Freq: Two times a day (BID) | ORAL | 11 refills | Status: AC
Start: 2021-07-02 — End: 2022-07-02

## 2021-07-02 MED ORDER — EPHEDRINE SULFATE-NACL 50-0.9 MG/10ML-% IV SOSY
PREFILLED_SYRINGE | INTRAVENOUS | Status: DC | PRN
  Administered 2021-07-02: 10 mg via INTRAVENOUS

## 2021-07-02 MED ORDER — INSULIN ASPART 100 UNIT/ML IJ SOLN
10.0000 [IU] | Freq: Once | INTRAMUSCULAR | Status: AC
  Administered 2021-07-02: 10 [IU] via SUBCUTANEOUS

## 2021-07-02 MED ORDER — LIDOCAINE 2% (20 MG/ML) 5 ML SYRINGE
INTRAMUSCULAR | Status: DC | PRN
  Administered 2021-07-02: 60 mg via INTRAVENOUS

## 2021-07-02 MED ORDER — CHLORHEXIDINE GLUCONATE 0.12 % MT SOLN
OROMUCOSAL | Status: AC
  Administered 2021-07-02: 15 mL via OROMUCOSAL
  Filled 2021-07-02: qty 15

## 2021-07-02 MED ORDER — LACTATED RINGERS IV SOLN: INTRAVENOUS | Status: DC

## 2021-07-02 MED ORDER — ROCURONIUM BROMIDE 10 MG/ML (PF) SYRINGE
PREFILLED_SYRINGE | INTRAVENOUS | Status: DC | PRN
  Administered 2021-07-02: 20 mg via INTRAVENOUS
  Administered 2021-07-02: 50 mg via INTRAVENOUS

## 2021-07-02 MED ORDER — SODIUM CHLORIDE 0.9 % IV SOLN
250.0000 mg | Freq: Once | INTRAVENOUS | Status: AC
  Administered 2021-07-02: 250 mg via INTRAVENOUS
  Filled 2021-07-02: qty 20

## 2021-07-02 MED ORDER — ACETAMINOPHEN 10 MG/ML IV SOLN: 1000.0000 mg | Freq: Once | INTRAVENOUS | Status: DC | PRN

## 2021-07-02 MED ORDER — SUCCINYLCHOLINE CHLORIDE 200 MG/10ML IV SOSY
PREFILLED_SYRINGE | INTRAVENOUS | Status: DC | PRN
  Administered 2021-07-02: 80 mg via INTRAVENOUS

## 2021-07-02 MED ORDER — PHENYLEPHRINE HCL-NACL 20-0.9 MG/250ML-% IV SOLN
INTRAVENOUS | Status: DC | PRN
  Administered 2021-07-02: 30 ug/min via INTRAVENOUS

## 2021-07-02 MED ORDER — ONDANSETRON HCL 4 MG/2ML IJ SOLN: 4.0000 mg | Freq: Once | INTRAMUSCULAR | Status: DC | PRN

## 2021-07-02 MED ORDER — DEXAMETHASONE SODIUM PHOSPHATE 10 MG/ML IJ SOLN
INTRAMUSCULAR | Status: DC | PRN
  Administered 2021-07-02: 10 mg via INTRAVENOUS

## 2021-07-02 MED ORDER — PHENYLEPHRINE 40 MCG/ML (10ML) SYRINGE FOR IV PUSH (FOR BLOOD PRESSURE SUPPORT)
PREFILLED_SYRINGE | INTRAVENOUS | Status: DC | PRN
  Administered 2021-07-02 (×4): 80 ug via INTRAVENOUS

## 2021-07-02 MED ORDER — FENTANYL CITRATE (PF) 100 MCG/2ML IJ SOLN: 25.0000 ug | INTRAMUSCULAR | Status: DC | PRN

## 2021-07-02 MED ORDER — ONDANSETRON HCL 4 MG/2ML IJ SOLN
INTRAMUSCULAR | Status: DC | PRN
  Administered 2021-07-02: 4 mg via INTRAVENOUS

## 2021-07-02 MED ORDER — FENTANYL CITRATE (PF) 250 MCG/5ML IJ SOLN
INTRAMUSCULAR | Status: DC | PRN
  Administered 2021-07-02: 100 ug via INTRAVENOUS

## 2021-07-02 MED ORDER — SUGAMMADEX SODIUM 200 MG/2ML IV SOLN
INTRAVENOUS | Status: DC | PRN
  Administered 2021-07-02 (×2): 50 mg via INTRAVENOUS
  Administered 2021-07-02: 100 mg via INTRAVENOUS

## 2021-07-02 SURGICAL SUPPLY — 46 items

## 2021-07-02 NOTE — Anesthesia Preprocedure Evaluation (Addendum)
Anesthesia Evaluation  Patient identified by MRN, date of birth, ID band Patient awake    Reviewed: Allergy & Precautions, NPO status , Patient's Chart, lab work & pertinent test results  Airway Mallampati: II  TM Distance: >3 FB Neck ROM: Full    Dental  (+) Teeth Intact   Pulmonary COPD, Current Smoker and Patient abstained from smoking.,  Left lung mass    + decreased breath sounds      Cardiovascular hypertension, + CAD, + Cardiac Stents (2016) and +CHF   Rhythm:Regular Rate:Normal     Neuro/Psych negative neurological ROS  negative psych ROS   GI/Hepatic negative GI ROS, Neg liver ROS,   Endo/Other  diabetes, Type 2, Oral Hypoglycemic Agents  Renal/GU negative Renal ROS  negative genitourinary   Musculoskeletal  (+) Arthritis ,   Abdominal (+)  Abdomen: soft.    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   Anesthesia Other Findings   Reproductive/Obstetrics                           Anesthesia Physical Anesthesia Plan  ASA: 3  Anesthesia Plan: General   Post-op Pain Management:    Induction: Intravenous  PONV Risk Score and Plan: 1 and Ondansetron and Treatment may vary due to age or medical condition  Airway Management Planned: Mask and Oral ETT  Additional Equipment: None  Intra-op Plan:   Post-operative Plan: Extubation in OR  Informed Consent: I have reviewed the patients History and Physical, chart, labs and discussed the procedure including the risks, benefits and alternatives for the proposed anesthesia with the patient or authorized representative who has indicated his/her understanding and acceptance.     Dental advisory given  Plan Discussed with: CRNA  Anesthesia Plan Comments: (Lab Results      Component                Value               Date                      WBC                      47.3 (H)             07/02/2021                HGB                      11.9 (L)            07/02/2021                HCT                      36.5 (L)            07/02/2021                MCV                      83.3                07/02/2021                PLT  334                 07/02/2021           Lab Results      Component                Value               Date                      NA                       137                 07/01/2021                K                        3.7                 07/01/2021                CO2                      21 (L)              07/01/2021                GLUCOSE                  122 (H)             07/01/2021                BUN                      11                  07/01/2021                CREATININE               0.93                07/01/2021                CALCIUM                  8.2 (L)             07/01/2021                GFRNONAA                 >60                 07/01/2021                GFRAA                    62                  11/05/2020            ECHO 2016: -------------------------------------------------------------------  Study Conclusions  - Left ventricle: Global LV longitudinal strain is -14.3% The  cavity size was normal. Systolic function was mildly to  moderately reduced. The estimated ejection fraction was in the  range of 40% to 45%. There is severe hypokinesis  of the  entireinferolateral and inferior myocardium. There is mild  hypokinesis of the entirelateral myocardium. Features are  consistent with a pseudonormal left ventricular filling pattern,  with concomitant abnormal relaxation and increased filling  pressure (grade 2 diastolic dysfunction).  - Aortic valve: Trileaflet; mildly thickened, mildly calcified  leaflets.  - Mitral valve: There was mild regurgitation.  - Tricuspid valve: There was mild regurgitation. )      Anesthesia Quick Evaluation

## 2021-07-02 NOTE — Discharge Instructions (Addendum)
You should expect to hear back from a provider in regards to the results of your bronchoscopy and biopsy with Dr. [name]. Please continue to take your medications as listed on your discharge instructions, and to make an appointment with your PCP within 1 week of discharge for any medication adjustments you may require. Your hemoglobin A1C which is a measure of your diabetes was found to be elevated, so your metformin medication is being changed from 1 time a day to 2 times.

## 2021-07-02 NOTE — Discharge Summary (Addendum)
Name: Frank Hart MRN: [id number] DOB: 11-14-1946 75 y.o. PCP: Frank Gerold, DO  Date of Admission: 06/30/2021  7:55 AM Date of Discharge:  07/02/21 Attending Physician: Dr. Evette Hart  DISCHARGE DIAGNOSIS:  Primary Problem: Lung mass   Hospital Problems: Principal Problem:   Lung mass Active Problems:   Essential hypertension   CAD (coronary artery disease), native coronary artery   T2DM (type 2 diabetes mellitus) ([place])   Emphysema/COPD ([place])   Neutrophilic leukemoid reaction    DISCHARGE MEDICATIONS:   Allergies as of 07/02/2021       Reactions   Penicillins Other (See Comments)   Syncope   Asa [aspirin] Other (See Comments)   Trigger stomach issues         Medication List     TAKE these medications    atorvastatin 40 MG tablet Commonly known as: Lipitor Take 1 tablet (40 mg total) by mouth daily.   metFORMIN 1000 MG tablet Commonly known as: Glucophage Take 1 tablet (1,000 mg total) by mouth 2 (two) times daily with a meal. What changed: when to take this        DISPOSITION AND FOLLOW-UP:  [name] was discharged from [facility] in Stable condition. At the hospital follow up visit please address:  Follow-up Recommendations: Consults: pulmonary, oncology   Labs: none  Studies: bronchoscopy results  Medications: increase metformin from 1 times a day to 2 times a day.   Follow-up Appointments:  Follow-up Information     Frank Gerold, DO. Schedule an appointment as soon as possible for a visit in 1 week(s).   Specialty: Internal Medicine Contact information: [place] [address] [phone]                 HOSPITAL COURSE:  Patient Summary: Lung mass 06/27/21 CTA Chest revealed a partially enhancing masslike opacity in the lingula 5.1 x 4.0 x 4.4cm suspicious for primary bronchogenic malignancy in addition to multiple nodules up to 1cm in b/l lungs. Pt is cachectic with DOE, hemoptysis and 20 pound  weight loss in one month, in broader context of 58 year smoking history. 07/01/21 MRI Brain revealed no signs of metastasis. Underwent bronchoscopy and biopsy with Dr. Valeta Hart on 07/01/21, who plans to contact Dr Frank Hart after results.   Neutrophilia WBC on admission at 47.1k with PMNs at 42.9k. 7/25 and 7/30 CXRs showing unchanged airspace disease in the lingula (as above) while receiving 8 days of 100mg  Doxy and 50mg  Levaquin from 7/25 to 8/1. Pt has remained afebrile and clinically appears very well aside from cachexia, making an obstructive pneumonia felt less likely. Without PET or BM bx evaluation yet, can't rule out bone mets causing neutrophilia vs solid-tumor associated paraneoplastic leukemoid reaction. Pt has not received steroids recently.   T2DM 06/30/21 A1C at 9.8, increased from 11/05/20 at 8.1 while on 1000mg  Metformin daily. Glucose at 322 on admission. On 1000mg  Metformin daily as outpatient. 06/27/21 A/P generally benign but did not fullness in pancreatic distal body. Though lung primary is currently favored. Pt received sliding scale insulin during admission. -Will need re-evaluation with PCP post admission. Could consider SGLT2 inhibitor for cardiovascular protection. -Increase to 1000mg  Metformin BID    Unexpected weight loss 20 pounds of unexpected weight loss in the past month from a slender body habitus at baseline. Appears cachectic. Gave Boost nutritional supplements during admission.   Anticoagulation Pt reports that he has taken Plavix daily since 2016 when he had a stent placed in  the L circumflex. Reports running out of this medication recently. Held during this admission for bronchoscopy. -Determine with PCP if you would like to resume Plavix    DISCHARGE INSTRUCTIONS:   Discharge Instructions     Call MD for:  difficulty breathing, headache or visual disturbances   Complete by: As directed    Call MD for:  hives   Complete by: As directed    Call MD for:  persistant  dizziness or light-headedness   Complete by: As directed    Call MD for:  persistant nausea and vomiting   Complete by: As directed    Call MD for:  redness, tenderness, or signs of infection (pain, swelling, redness, odor or green/yellow discharge around incision site)   Complete by: As directed    Call MD for:  severe uncontrolled pain   Complete by: As directed    Call MD for:  temperature >100.4   Complete by: As directed    Diet - low sodium heart healthy   Complete by: As directed    Increase activity slowly   Complete by: As directed        SUBJECTIVE:  No acute overnight events. Patient was seen at bedside during rounds this morning. Pt reports feeling well this morning. Pt has no complaints today. Pt denies any changes in pulmonary symptoms. No other complains or concerns at this time.   Discharge Vitals:   BP 113/80 (BP Location: Left Arm)   Pulse 87   Temp 97.7 F (36.5 C) (Oral)   Resp 16   Ht 5\' 11"  (1.803 m)   Wt 53.2 kg   SpO2 96%   BMI 16.36 kg/m   OBJECTIVE:  Constitutional: alert, well-appearing, in no acute distress Cardiovascular: regular rate and rhythm, no m/r/g Pulmonary/Chest: normal work of breathing on room air, lungs clear to auscultation bilaterally Abdominal: soft, non-tender to palpation, non-distended MSK: normal bulk and tone Neurological: alert & oriented x 3 Skin: warm and dry Psych: normal behavior, normal affect  Pertinent Labs, Studies, and Procedures:  CBC Latest Ref Rng & Units 07/02/2021 07/01/2021 06/30/2021  WBC 4.0 - 10.5 K/uL 47.3(H) 46.3(H) 47.1(H)  Hemoglobin 13.0 - 17.0 g/dL 11.9(L) 11.0(L) 11.3(L)  Hematocrit 39.0 - 52.0 % 36.5(L) 35.7(L) 36.7(L)  Platelets 150 - 400 K/uL 334 332 335    CMP Latest Ref Rng & Units 07/01/2021 06/30/2021 06/28/2021  Glucose 70 - 99 mg/dL 122(H) 332(H) 181(H)  BUN 8 - 23 mg/dL 11 9 11   Creatinine 0.61 - 1.24 mg/dL 0.93 1.12 0.98  Sodium 135 - 145 mmol/L 137 134(L) 139  Potassium 3.5 - 5.1 mmol/L  3.7 4.8 4.0  Chloride 98 - 111 mmol/L 109 103 105  CO2 22 - 32 mmol/L 21(L) 26 24  Calcium 8.9 - 10.3 mg/dL 8.2(L) 8.5(L) 8.5(L)  Total Protein 6.5 - 8.1 g/dL - - 6.9  Total Bilirubin 0.3 - 1.2 mg/dL - - 0.5  Alkaline Phos 38 - 126 U/L - - 133(H)  AST 15 - 41 U/L - - 12(L)  ALT 0 - 44 U/L - - 12    MR BRAIN W WO CONTRAST  Result Date: 07/01/2021 CLINICAL DATA:  Non-small cell lung cancer.  Staging. EXAM: MRI HEAD WITHOUT AND WITH CONTRAST TECHNIQUE: Multiplanar, multiecho pulse sequences of the brain and surrounding structures were obtained without and with intravenous contrast. CONTRAST:  31mL GADAVIST GADOBUTROL 1 MMOL/ML IV SOLN COMPARISON:  None. FINDINGS: Brain: Diffusion imaging does not show any acute or subacute infarction.  There is some artifactual signal within the pons midbrain junction on the axial diffusion imaging. There are mild chronic small-vessel ischemic changes of the pons. No focal cerebellar finding. Cerebral hemispheres show age related volume loss with mild chronic small-vessel change of the deep and subcortical white matter. No cortical or large vessel territory infarction. No evidence of primary or metastatic mass lesion. No hemorrhage, hydrocephalus or extra-axial collection. Vascular: Major vessels at the base of the brain show flow. Skull and upper cervical spine: Somewhat heterogeneous marrow pattern, but without strong suspicion of bony metastatic disease. Sinuses/Orbits: Clear/normal Other: None IMPRESSION: No evidence of metastatic disease. Chronic small-vessel ischemic changes of the pons and cerebral hemispheric white matter. Electronically Signed   By: Nelson Chimes M.D.   On: 07/01/2021 14:10     Signed: Lajean Manes, MD Internal Medicine Resident, PGY-1 [facility]  Pager: [phone] 3:08 PM, 07/02/2021

## 2021-07-02 NOTE — Anesthesia Procedure Notes (Signed)
Procedure Name: Intubation Date/Time: 07/02/2021 10:54 AM Performed by: Janene Harvey, CRNA Pre-anesthesia Checklist: Patient identified, Emergency Drugs available, Suction available and Patient being monitored Patient Re-evaluated:Patient Re-evaluated prior to induction Oxygen Delivery Method: Circle system utilized Preoxygenation: Pre-oxygenation with 100% oxygen Induction Type: IV induction Ventilation: Mask ventilation without difficulty Laryngoscope Size: Mac and 4 Grade View: Grade I Tube type: Oral Tube size: 8.5 mm Number of attempts: 1 Airway Equipment and Method: Stylet and Oral airway Placement Confirmation: ETT inserted through vocal cords under direct vision, positive ETCO2 and breath sounds checked- equal and bilateral Secured at: 22 cm Tube secured with: Tape Dental Injury: Teeth and Oropharynx as per pre-operative assessment

## 2021-07-02 NOTE — Interval H&P Note (Signed)
History and Physical Interval Note:  07/02/2021 10:26 AM  Frank Hart  has presented today for surgery, with the diagnosis of lung mass, lung nodules.  The various methods of treatment have been discussed with the patient and family. After consideration of risks, benefits and other options for treatment, the patient has consented to  Procedure(s) with comments: [name] (Left) - ION as a surgical intervention.  The patient's history has been reviewed, patient examined, no change in status, stable for surgery.  I have reviewed the patient's chart and labs.  Questions were answered to the patient's satisfaction.     [place]

## 2021-07-02 NOTE — Care Management Obs Status (Signed)
[facility] NOTIFICATION   Patient Details  Name: Frank Hart MRN: [id number] Date of Birth: Apr 27, 1946   Medicare Observation Status Notification Given:  Yes    Pollie Friar, RN 07/02/2021, 3:37 PM

## 2021-07-02 NOTE — Progress Notes (Signed)
Patient left by wheelchair accompanied by tech

## 2021-07-02 NOTE — Op Note (Addendum)
Video Bronchoscopy with Robotic Assisted Bronchoscopic Navigation  Video Bronchoscopy with Endobronchial Ultrasound Procedure Note   Date of Operation: 06/22/2021  Pre-op Diagnosis: Left upper lobe mass, left upper lobe nodules  Post-op Diagnosis: Left upper lobe mass, left upper lobe nodule  Surgeon: Garner Nash, DO  Assistants: None  Anesthesia: General endotracheal anesthesia  Operation: Flexible video fiberoptic bronchoscopy with robotic assistance and biopsies.  Estimated Blood Loss: Minimal  Complications: None  Indications and History: Frank Hart is a 75 y.o. male with history of current smoker, left upper lobe mass, left upper lobe nodules. The risks, benefits, complications, treatment options and expected outcomes were discussed with the patient.  The possibilities of pneumothorax, pneumonia, reaction to medication, pulmonary aspiration, perforation of a viscus, bleeding, failure to diagnose a condition and creating a complication requiring transfusion or operation were discussed with the patient who freely signed the consent.    Description of Procedure: The patient was seen in the Preoperative Area, was examined and was deemed appropriate to proceed.  The patient was taken to [facility] endoscopy room 3, identified as Frank Hart and the procedure verified as Flexible Video Fiberoptic Bronchoscopy.  A Time Out was held and the above information confirmed.   Prior to the date of the procedure a high-resolution CT scan of the chest was performed. Utilizing ION software program a virtual tracheobronchial tree was generated to allow the creation of distinct navigation pathways to the patient's parenchymal abnormalities. After being taken to the operating room general anesthesia was initiated and the patient  was orally intubated. The video fiberoptic bronchoscope was introduced via the endotracheal tube and a general inspection was performed which showed normal right and left lung  anatomy, aspiration of the bilateral mainstems was completed to remove any remaining secretions. Robotic catheter inserted into patient's endotracheal tube.   Target #1 lingular mass: The distinct navigation pathways prepared prior to this procedure were then utilized to navigate to patient's lesion identified on CT scan. The robotic catheter was secured into place and the vision probe was withdrawn.  Lesion location was approximated using fluoroscopy and radial endobronchial ultrasound for peripheral targeting. Under fluoroscopic guidance transbronchial needle brushings, transbronchial needle biopsies, and transbronchial forceps biopsies were performed to be sent for cytology and pathology. A bronchioalveolar lavage was performed in the left upper lobe lingula and sent for cytology.  Target #2 left upper lobe lateral nodule: The distinct navigation pathways prepared prior to this procedure were then utilized to navigate to patient's lesion identified on CT scan. The robotic catheter was secured into place and the vision probe was withdrawn.  Lesion location was approximated using fluoroscopy and radial endobronchial ultrasound for peripheral targeting. Under fluoroscopic guidance transbronchial needle brushings, transbronchial needle biopsies, and transbronchial forceps biopsies were performed to be sent for cytology and pathology. A bronchioalveolar lavage was performed in the left upper lobe and sent for cytology.  Target #3: Left upper lobe apical nodule: The distinct navigation pathways prepared prior to this procedure were then utilized to navigate to patient's lesion identified on CT scan. The robotic catheter was secured into place and the vision probe was withdrawn.  Lesion location was approximated using fluoroscopy and radial endobronchial ultrasound for peripheral targeting. Under fluoroscopic guidance transbronchial needle brushings, transbronchial needle biopsies, and transbronchial forceps  biopsies were performed to be sent for cytology and pathology. A bronchioalveolar lavage was performed in the left upper lobe and sent for cytology.  Target #4: EBUS Station 7  The standard scope was then  withdrawn and the endobronchial ultrasound was used to identify and characterize the peritracheal, hilar and bronchial lymph nodes. Inspection showed enlarged subcarinal node. Using real-time ultrasound guidance Wang needle biopsies were take from Station 7 nodes and were sent for cytology.   At the end of the procedure a general airway inspection was performed and there was no evidence of active bleeding. The bronchoscope was removed.  The patient tolerated the procedure well. There was no significant blood loss and there were no obvious complications. A post-procedural chest x-ray is pending.  Samples Target #1: 1. Transbronchial needle brushings from left upper lobe lingula 2. Transbronchial Wang needle biopsies from left upper lobe lingula 3. Transbronchial forceps biopsies from left upper lobe lingula 4. Bronchoalveolar lavage from left upper lobe lingula  Samples Target #2: 1. Transbronchial needle brushings from left upper lobe lateral nodule 2. Transbronchial Wang needle biopsies from left upper lobe lateral nodule 3. Transbronchial forceps biopsies from left upper lobe lateral nodule 4. Bronchoalveolar lavage from left upper lobe  Samples Target #3: 1. Transbronchial needle brushings from left upper lobe apical nodule 2. Transbronchial Wang needle biopsies from left upper lobe apical nodule 3. Transbronchial forceps biopsies from upper lobe apical nodule 4. Bronchoalveolar lavage from left upper lobe  Samples Target #4: 1. Wang needle biopsies from Station 7 node  Plans:  The patient will be discharged from the PACU to home when recovered from anesthesia and after chest x-ray is reviewed. We will review the cytology, pathology and microbiology results with the patient when they  become available. Outpatient followup will be with Dr. [name].   Garner Nash, DO [facility] 07/02/2021 1:02 PM

## 2021-07-02 NOTE — TOC Transition Note (Signed)
Transition of Care [facility]) - CM/SW Discharge Note   Patient Details  Name: Frank Hart MRN: [id number] Date of Birth: 08/28/1946  Transition of Care [facility]) CM/SW Contact:  Pollie Friar, RN Phone Number: 07/02/2021, 3:59 PM   Clinical Narrative:    Pt is discharging home with self care. No needs per TOC.   Final next level of care: Home/Self Care Barriers to Discharge: No Barriers Identified   Patient Goals and CMS Choice        Discharge Placement                       Discharge Plan and Services                                     Social Determinants of Health (SDOH) Interventions     Readmission Risk Interventions No flowsheet data found.

## 2021-07-02 NOTE — Hospital Course (Addendum)
Lung mass 06/27/21 CTA Chest revealed a partially enhancing masslike opacity in the lingula 5.1 x 4.0 x 4.4cm suspicious for primary bronchogenic malignancy in addition to multiple nodules up to 1cm in b/l lungs. Pt is cachectic with DOE, hemoptysis and 20 pound weight loss in one month, in broader context of 58 year smoking history. 07/01/21 MRI Brain revealed no signs of metastasis. Underwent bronchoscopy and biopsy with Dr. [name] on 07/01/21, who plans to contact Dr Julien Nordmann after results.   Neutrophilia WBC on admission at 47.1k with PMNs at 42.9k. 7/25 and 7/30 CXRs showing unchanged airspace disease in the lingula (as above) while receiving 8 days of 100mg  Doxy and 50mg  Levaquin from 7/25 to 8/1. Pt has remained afebrile and clinically appears very well aside from cachexia, making an obstructive pneumonia felt less likely. Without PET or BM bx evaluation yet, can't rule out bone mets causing neutrophilia vs solid-tumor associated paraneoplastic leukemoid reaction. Pt has not received steroids recently.   T2DM 06/30/21 A1C at 9.8, increased from 11/05/20 at 8.1 while on 1000mg  Metformin daily. Glucose at 322 on admission. On 1000mg  Metformin daily as outpatient. 06/27/21 A/P generally benign but did not fullness in pancreatic distal body. Though lung primary is currently favored. Pt received sliding scale insulin during admission. -Will need re-evaluation with PCP post admission. Could consider SGLT2 inhibitor for cardiovascular protection. -Increase to 1000mg  Metformin BID    Unexpected weight loss 20 pounds of unexpected weight loss in the past month from a slender body habitus at baseline. Appears cachectic. Gave Boost nutritional supplements during admission.   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 Reports running out of this medication recently. Held during this admission for bronchoscopy. -Determine with PCP if you would  like to resume Plavix

## 2021-07-02 NOTE — Care Management CC44 (Signed)
Condition Code 44 Documentation Completed  Patient Details  Name: Frank Hart MRN: [id number] Date of Birth: 1946-11-25   Condition Code 44 given:  Yes Patient signature on Condition Code 44 notice:  Yes Documentation of 2 MD's agreement:  Yes Code 44 added to claim:  Yes    Pollie Friar, RN 07/02/2021, 3:37 PM

## 2021-07-02 NOTE — Transfer of Care (Signed)
Immediate Anesthesia Transfer of Care Note  Patient: Frank Hart  Procedure(s) Performed: VIDEO BRONCHOSCOPY WITH ENDOBRONCHIAL NAVIGATION (Left) RADIAL ENDOBRONCHIAL ULTRASOUND BRONCHIAL BIOPSIES BRONCHIAL BRUSHINGS BRONCHIAL WASHINGS BRONCHIAL NEEDLE ASPIRATION BIOPSIES  Patient Location: PACU  Anesthesia Type:General  Level of Consciousness: drowsy and patient cooperative  Airway & Oxygen Therapy: Patient Spontanous Breathing and Patient connected to face mask oxygen  Post-op Assessment: Report given to RN and Post -op Vital signs reviewed and stable  Post vital signs: Reviewed  Last Vitals:  Vitals Value Taken Time  BP 113/68 07/02/21 1315  Temp    Pulse 85 07/02/21 1316  Resp 21 07/02/21 1316  SpO2 100 % 07/02/21 1316  Vitals shown include unvalidated device data.  Last Pain:  Vitals:   07/02/21 0956  TempSrc:   PainSc: 0-No pain         Complications: No notable events documented.

## 2021-07-03 ENCOUNTER — Encounter (HOSPITAL_COMMUNITY): Payer: Self-pay | Admitting: Pulmonary Disease

## 2021-07-03 NOTE — Anesthesia Postprocedure Evaluation (Signed)
Anesthesia Post Note  Patient: Frank Hart  Procedure(s) Performed: VIDEO BRONCHOSCOPY WITH ENDOBRONCHIAL NAVIGATION (Left) RADIAL ENDOBRONCHIAL ULTRASOUND BRONCHIAL BIOPSIES BRONCHIAL BRUSHINGS BRONCHIAL WASHINGS BRONCHIAL NEEDLE ASPIRATION BIOPSIES     Patient location during evaluation: PACU Anesthesia Type: General Level of consciousness: awake and alert Pain management: pain level controlled Vital Signs Assessment: post-procedure vital signs reviewed and stable Respiratory status: spontaneous breathing, nonlabored ventilation, respiratory function stable and patient connected to nasal cannula oxygen Cardiovascular status: blood pressure returned to baseline and stable Postop Assessment: no apparent nausea or vomiting Anesthetic complications: no   No notable events documented.  Last Vitals:  Vitals:   07/02/21 1528 07/02/21 1558  BP: 116/72 102/70  Pulse: 95 95  Resp: 20 16  Temp: 36.6 C 36.6 C  SpO2: 99% 98%    Last Pain:  Vitals:   07/02/21 1558  TempSrc: Oral  PainSc:                  March Rummage Meha Vidrine

## 2021-07-06 ENCOUNTER — Telehealth: Payer: Self-pay | Admitting: Internal Medicine

## 2021-07-06 NOTE — Telephone Encounter (Signed)
Received a new pt referral for new dx of lung cancer. Frank Hart has been cld and scheduled to see Dr. Julien Nordmann on 8/12 at 10am w/labs at 930am. Pt aware to arrive 15 minutes early.

## 2021-07-07 ENCOUNTER — Encounter: Payer: Self-pay | Admitting: *Deleted

## 2021-07-07 LAB — CYTOLOGY - NON PAP

## 2021-07-07 NOTE — Progress Notes (Signed)
I called patient and reviewed his upcoming appointment with Dr. Julien Nordmann on 07/10/2021.  We discussed the time of arrival and location.  Frank Hart verbalized understanding.

## 2021-07-09 ENCOUNTER — Other Ambulatory Visit: Payer: Self-pay | Admitting: Medical Oncology

## 2021-07-09 ENCOUNTER — Other Ambulatory Visit: Payer: Self-pay | Admitting: *Deleted

## 2021-07-09 ENCOUNTER — Encounter: Payer: Medicare PPO | Admitting: Student

## 2021-07-09 ENCOUNTER — Encounter: Payer: Self-pay | Admitting: *Deleted

## 2021-07-09 DIAGNOSIS — C3490 Malignant neoplasm of unspecified part of unspecified bronchus or lung: Secondary | ICD-10-CM

## 2021-07-09 NOTE — Progress Notes (Signed)
Per Dr. Julien Nordmann, pathology notified of his request for PDL 1 testing on recent biopsy.

## 2021-07-09 NOTE — Progress Notes (Signed)
The proposed treatment discussed in cancer conference is for discussion purpose only and is not a binding recommendation. The patient was not physically examined nor present for their treatment options. Therefore, final treatment plans cannot be decided.  ?

## 2021-07-10 ENCOUNTER — Other Ambulatory Visit: Payer: Self-pay

## 2021-07-10 ENCOUNTER — Inpatient Hospital Stay: Payer: Medicare PPO | Attending: Internal Medicine | Admitting: Internal Medicine

## 2021-07-10 ENCOUNTER — Inpatient Hospital Stay: Payer: Medicare PPO

## 2021-07-10 ENCOUNTER — Telehealth: Payer: Self-pay | Admitting: Medical Oncology

## 2021-07-10 ENCOUNTER — Encounter: Payer: Self-pay | Admitting: *Deleted

## 2021-07-10 VITALS — BP 119/86 | HR 98 | Temp 96.4°F | Resp 19 | Ht 71.0 in | Wt 126.4 lb

## 2021-07-10 DIAGNOSIS — Z7189 Other specified counseling: Secondary | ICD-10-CM | POA: Insufficient documentation

## 2021-07-10 DIAGNOSIS — Z5111 Encounter for antineoplastic chemotherapy: Secondary | ICD-10-CM | POA: Diagnosis not present

## 2021-07-10 DIAGNOSIS — Z79899 Other long term (current) drug therapy: Secondary | ICD-10-CM | POA: Insufficient documentation

## 2021-07-10 DIAGNOSIS — D72829 Elevated white blood cell count, unspecified: Secondary | ICD-10-CM | POA: Diagnosis not present

## 2021-07-10 DIAGNOSIS — C3492 Malignant neoplasm of unspecified part of left bronchus or lung: Secondary | ICD-10-CM

## 2021-07-10 DIAGNOSIS — F17210 Nicotine dependence, cigarettes, uncomplicated: Secondary | ICD-10-CM | POA: Diagnosis not present

## 2021-07-10 DIAGNOSIS — C3490 Malignant neoplasm of unspecified part of unspecified bronchus or lung: Secondary | ICD-10-CM

## 2021-07-10 DIAGNOSIS — I252 Old myocardial infarction: Secondary | ICD-10-CM | POA: Insufficient documentation

## 2021-07-10 DIAGNOSIS — C3412 Malignant neoplasm of upper lobe, left bronchus or lung: Secondary | ICD-10-CM | POA: Diagnosis not present

## 2021-07-10 DIAGNOSIS — I10 Essential (primary) hypertension: Secondary | ICD-10-CM

## 2021-07-10 DIAGNOSIS — I119 Hypertensive heart disease without heart failure: Secondary | ICD-10-CM | POA: Diagnosis not present

## 2021-07-10 DIAGNOSIS — D72823 Leukemoid reaction: Secondary | ICD-10-CM

## 2021-07-10 DIAGNOSIS — Z720 Tobacco use: Secondary | ICD-10-CM

## 2021-07-10 LAB — CBC WITH DIFFERENTIAL (CANCER CENTER ONLY)
Abs Immature Granulocytes: 1.26 10*3/uL — ABNORMAL HIGH (ref 0.00–0.07)
Basophils Absolute: 0.2 10*3/uL — ABNORMAL HIGH (ref 0.0–0.1)
Basophils Relative: 0 %
Eosinophils Absolute: 0.1 10*3/uL (ref 0.0–0.5)
Eosinophils Relative: 0 %
HCT: 35.4 % — ABNORMAL LOW (ref 39.0–52.0)
Hemoglobin: 11.7 g/dL — ABNORMAL LOW (ref 13.0–17.0)
Immature Granulocytes: 2 %
Lymphocytes Relative: 3 %
Lymphs Abs: 1.9 10*3/uL (ref 0.7–4.0)
MCH: 27 pg (ref 26.0–34.0)
MCHC: 33.1 g/dL (ref 30.0–36.0)
MCV: 81.6 fL (ref 80.0–100.0)
Monocytes Absolute: 1.9 10*3/uL — ABNORMAL HIGH (ref 0.1–1.0)
Monocytes Relative: 3 %
Neutro Abs: 52.3 10*3/uL — ABNORMAL HIGH (ref 1.7–7.7)
Neutrophils Relative %: 92 %
Platelet Count: 357 10*3/uL (ref 150–400)
RBC: 4.34 MIL/uL (ref 4.22–5.81)
RDW: 13.5 % (ref 11.5–15.5)
WBC Count: 57.7 10*3/uL (ref 4.0–10.5)
nRBC: 0 % (ref 0.0–0.2)

## 2021-07-10 LAB — CMP (CANCER CENTER ONLY)
ALT: 15 U/L (ref 0–44)
AST: 11 U/L — ABNORMAL LOW (ref 15–41)
Albumin: 2.9 g/dL — ABNORMAL LOW (ref 3.5–5.0)
Alkaline Phosphatase: 172 U/L — ABNORMAL HIGH (ref 38–126)
Anion gap: 11 (ref 5–15)
BUN: 12 mg/dL (ref 8–23)
CO2: 27 mmol/L (ref 22–32)
Calcium: 9.3 mg/dL (ref 8.9–10.3)
Chloride: 104 mmol/L (ref 98–111)
Creatinine: 1.08 mg/dL (ref 0.61–1.24)
GFR, Estimated: >60 mL/min (ref >60)
Glucose, Bld: 160 mg/dL — ABNORMAL HIGH (ref 70–99)
Potassium: 4.2 mmol/L (ref 3.5–5.1)
Sodium: 142 mmol/L (ref 135–145)
Total Bilirubin: 0.4 mg/dL (ref 0.3–1.2)
Total Protein: 7.5 g/dL (ref 6.5–8.1)

## 2021-07-10 MED ORDER — DOXYCYCLINE HYCLATE 100 MG PO TABS: 100.0000 mg | ORAL_TABLET | Freq: Two times a day (BID) | ORAL | 0 refills | Status: DC

## 2021-07-10 NOTE — Progress Notes (Signed)
Oncology Nurse Navigator Documentation  Oncology Nurse Navigator Flowsheets 07/10/2021  Abnormal Finding Date 06/27/2021  Confirmed Diagnosis Date 07/02/2021  Diagnosis Status Confirmed Diagnosis Complete  Planned Course of Treatment Chemo/Radiation Concurrent  Phase of Treatment Radiation  Navigator Follow Up Date: 07/14/2021  Navigator Follow Up Reason: Appointment Review  Navigator Location [facility]  Navigator Encounter Type Clinic/MDC  Patient Visit Type Initial;MedOnc  Treatment Phase Pre-Tx/Tx Discussion  Barriers/Navigation Needs Education  Education Newly Diagnosed Cancer Education;Other;Understanding Cancer/ Treatment Options;Concerns with Finances/ Eligibility  Interventions Psycho-Social Support;Education  Acuity Level 2-Minimal Needs (1-2 Barriers Identified)  Education Method Verbal;Written  Time Spent with Patient 63

## 2021-07-10 NOTE — Progress Notes (Signed)
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
## 2021-07-10 NOTE — Progress Notes (Signed)
I spoke with Frank Hart and his friend today at his first visit to see Dr. Julien Nordmann. Per Dr. Julien Nordmann, patient has stage III lung cancer ans plan of care is concurrent chemo rad.  I gave and explained information on diagnosis and plan of care.  All questions answered.

## 2021-07-10 NOTE — Telephone Encounter (Signed)
CRITICAL VALUE STICKER  CRITICAL VALUE: WBC 57.7   RECEIVER (on-site recipient of call):Soul Hackman  DATE & TIME NOTIFIED: 07/10/21@0842   MESSENGER (representative from lab):Pam  MD NOTIFIED: [place]  RESPONSE:  Dr Julien Nordmann is scheduled to see pt this am.

## 2021-07-13 ENCOUNTER — Encounter: Payer: Self-pay | Admitting: *Deleted

## 2021-07-13 NOTE — Progress Notes (Signed)
Thoracic Location of Tumor / Histology: Left Upper Lung  Patient presented to the ER on July 30 with complaints of hemoptysis.  MRI Brain 07/01/2021: No evidence of metastatic disease to the brain.  CT Abd/Pelvis 06/28/2021: Fullness in the distal body of the pancreas without discrete mass and questionable areas of enhancement in the ascending colon but not well assessed on the current exam.  No adrenal nodule or adenopathy or additional findings to suggest abdominal pelvic metastatic disease.   CTA: No evidence for pulmonary embolism but there was a partially enhancing mass-like opacity in the lingula measuring 5.1 x 4.0 x 4.4 cm suspicious for primary bronchogenic malignancy.  There was also multiple spiculated nodules within both lungs the largest in the left upper lobe measuring 1.1 cm and may be metastatic or additional primaries.  There was also scattered prominent mildly enlarged mediastinal and hilar nodes that were nonspecific.  Biopsies of LUL 07/02/2021    Tobacco/Marijuana/Snuff/ETOH use: Current Smoker  Past/Anticipated interventions by cardiothoracic surgery, if any:   Past/Anticipated interventions by medical oncology, if any:  Dr. Julien Nordmann 07/10/2021 -I recommended for the patient to complete the staging work-up by ordering a PET scan to rule out any other metastatic disease. I will also request the tissue to be sent for PD-L1 expression. -I discussed with the patient his treatment options and in the absence of clear evidence of metastatic disease, I recommended for him a course of concurrent chemoradiation with weekly carboplatin for AUC of 2 and paclitaxel 45 Mg/M2 for 6-7 weeks this will be followed by consolidation treatment with immunotherapy if the patient has no evidence for disease progression after the induction phase. -He is expected to start the first cycle of this treatment on 07/20/2021. -I will refer the patient to radiation oncology for evaluation and discussion of the  radiotherapy option. -The patient will come back for follow-up visit on 07/27/2021 and then every 2 weeks during the treatment.   Signs/Symptoms Weight changes, if any: 35-40 pounds lost in the last 3 months. Respiratory complaints, if any: Has some SOB, mostly noted when laying down.   Hemoptysis, if any: Has occasional productive cough, small amounts of blood noted. Pain issues, if any: Has some mild chest tightness at the lower end of his left chest.   SAFETY ISSUES: Prior radiation? No Pacemaker/ICD? No pacer, has a stent. Possible current pregnancy? N/a Is the patient on methotrexate? No  Current Complaints / other details:

## 2021-07-13 NOTE — Progress Notes (Signed)
I checked on Frank Hart insurance authorization for PET scan and this is not completed. I reached out to authorization team for an update. Wait for responds.

## 2021-07-15 ENCOUNTER — Ambulatory Visit
Admission: RE | Admit: 2021-07-15 | Discharge: 2021-07-15 | Disposition: A | Discharge status: Home / Self Care | Payer: Medicare PPO | Source: Ambulatory Visit | Attending: Radiation Oncology | Admitting: Radiation Oncology

## 2021-07-15 ENCOUNTER — Other Ambulatory Visit: Payer: Self-pay

## 2021-07-15 ENCOUNTER — Encounter: Payer: Self-pay | Admitting: Radiation Oncology

## 2021-07-15 VITALS — BP 116/69 | HR 87 | Temp 97.3°F | Resp 18 | Ht 71.0 in | Wt 124.5 lb

## 2021-07-15 DIAGNOSIS — Z7984 Long term (current) use of oral hypoglycemic drugs: Secondary | ICD-10-CM | POA: Insufficient documentation

## 2021-07-15 DIAGNOSIS — F17210 Nicotine dependence, cigarettes, uncomplicated: Secondary | ICD-10-CM | POA: Insufficient documentation

## 2021-07-15 DIAGNOSIS — J439 Emphysema, unspecified: Secondary | ICD-10-CM | POA: Insufficient documentation

## 2021-07-15 DIAGNOSIS — I255 Ischemic cardiomyopathy: Secondary | ICD-10-CM | POA: Insufficient documentation

## 2021-07-15 DIAGNOSIS — I700 Atherosclerosis of aorta: Secondary | ICD-10-CM | POA: Diagnosis not present

## 2021-07-15 DIAGNOSIS — I252 Old myocardial infarction: Secondary | ICD-10-CM | POA: Insufficient documentation

## 2021-07-15 DIAGNOSIS — C3492 Malignant neoplasm of unspecified part of left bronchus or lung: Secondary | ICD-10-CM

## 2021-07-15 DIAGNOSIS — E785 Hyperlipidemia, unspecified: Secondary | ICD-10-CM | POA: Insufficient documentation

## 2021-07-15 DIAGNOSIS — C3412 Malignant neoplasm of upper lobe, left bronchus or lung: Secondary | ICD-10-CM | POA: Diagnosis not present

## 2021-07-15 DIAGNOSIS — I2510 Atherosclerotic heart disease of native coronary artery without angina pectoris: Secondary | ICD-10-CM | POA: Diagnosis not present

## 2021-07-15 DIAGNOSIS — I10 Essential (primary) hypertension: Secondary | ICD-10-CM | POA: Insufficient documentation

## 2021-07-15 DIAGNOSIS — Z79899 Other long term (current) drug therapy: Secondary | ICD-10-CM | POA: Insufficient documentation
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
## 2021-07-16 ENCOUNTER — Ambulatory Visit (HOSPITAL_COMMUNITY)
Admission: RE | Admit: 2021-07-16 | Discharge: 2021-07-16 | Disposition: A | Discharge status: Home / Self Care | Payer: Medicare PPO | Source: Ambulatory Visit | Attending: Internal Medicine | Admitting: Internal Medicine

## 2021-07-16 ENCOUNTER — Inpatient Hospital Stay: Payer: Medicare PPO

## 2021-07-16 DIAGNOSIS — C3490 Malignant neoplasm of unspecified part of unspecified bronchus or lung: Secondary | ICD-10-CM | POA: Insufficient documentation

## 2021-07-16 DIAGNOSIS — Z131 Encounter for screening for diabetes mellitus: Secondary | ICD-10-CM | POA: Diagnosis not present

## 2021-07-16 LAB — GLUCOSE, CAPILLARY: Glucose-Capillary: 168 mg/dL — ABNORMAL HIGH (ref 70–99)

## 2021-07-16 MED ORDER — FLUDEOXYGLUCOSE F - 18 (FDG) INJECTION
6.1600 | Freq: Once | INTRAVENOUS | Status: AC
  Administered 2021-07-16: 6.16 via INTRAVENOUS

## 2021-07-17 ENCOUNTER — Telehealth: Payer: Self-pay | Admitting: *Deleted

## 2021-07-17 NOTE — Telephone Encounter (Signed)
I followed up on Frank Hart schedule and he is set up for his plan of care at this time. I called to see if he and his partner had any questions regarding his care.  They verbalized understanding of appts and care plan.  No other barriers identified at this time.

## 2021-07-20 DIAGNOSIS — I739 Peripheral vascular disease, unspecified: Secondary | ICD-10-CM | POA: Diagnosis not present

## 2021-07-20 DIAGNOSIS — E46 Unspecified protein-calorie malnutrition: Secondary | ICD-10-CM | POA: Diagnosis not present

## 2021-07-20 DIAGNOSIS — E1169 Type 2 diabetes mellitus with other specified complication: Secondary | ICD-10-CM | POA: Diagnosis not present

## 2021-07-20 DIAGNOSIS — Z79899 Other long term (current) drug therapy: Secondary | ICD-10-CM | POA: Diagnosis not present

## 2021-07-20 DIAGNOSIS — Z23 Encounter for immunization: Secondary | ICD-10-CM | POA: Diagnosis not present

## 2021-07-20 DIAGNOSIS — E785 Hyperlipidemia, unspecified: Secondary | ICD-10-CM | POA: Diagnosis not present

## 2021-07-20 DIAGNOSIS — F1290 Cannabis use, unspecified, uncomplicated: Secondary | ICD-10-CM | POA: Diagnosis not present

## 2021-07-20 DIAGNOSIS — Z0000 Encounter for general adult medical examination without abnormal findings: Secondary | ICD-10-CM | POA: Diagnosis not present

## 2021-07-20 DIAGNOSIS — C3490 Malignant neoplasm of unspecified part of unspecified bronchus or lung: Secondary | ICD-10-CM | POA: Diagnosis not present

## 2021-07-22 ENCOUNTER — Other Ambulatory Visit: Payer: Self-pay

## 2021-07-22 ENCOUNTER — Inpatient Hospital Stay: Payer: Medicare PPO

## 2021-07-22 ENCOUNTER — Ambulatory Visit: Payer: Medicare PPO | Admitting: Radiation Oncology

## 2021-07-22 ENCOUNTER — Other Ambulatory Visit: Payer: Self-pay | Admitting: Internal Medicine

## 2021-07-22 ENCOUNTER — Encounter: Payer: Self-pay | Admitting: Physician Assistant

## 2021-07-22 ENCOUNTER — Other Ambulatory Visit: Payer: Self-pay | Admitting: Medical Oncology

## 2021-07-22 DIAGNOSIS — C3412 Malignant neoplasm of upper lobe, left bronchus or lung: Secondary | ICD-10-CM | POA: Diagnosis not present

## 2021-07-22 DIAGNOSIS — Z510 Encounter for antineoplastic radiation therapy: Secondary | ICD-10-CM | POA: Diagnosis not present

## 2021-07-22 DIAGNOSIS — C3492 Malignant neoplasm of unspecified part of left bronchus or lung: Secondary | ICD-10-CM

## 2021-07-22 MED ORDER — PROCHLORPERAZINE MALEATE 10 MG PO TABS: 10.0000 mg | ORAL_TABLET | Freq: Four times a day (QID) | ORAL | 0 refills | Status: DC | PRN

## 2021-07-22 NOTE — Progress Notes (Signed)
Met with patient/accompanying adult at registration to introduce myself as Arboriculturist and to offer available resources.  Discussed one-time $1000 Radio broadcast assistant to assist with personal expenses while going through treatment.  Gave my card if interested in applying and for any additional financial questions or concerns.

## 2021-07-23 ENCOUNTER — Ambulatory Visit (INDEPENDENT_AMBULATORY_CARE_PROVIDER_SITE_OTHER): Payer: Medicare PPO | Admitting: Pulmonary Disease

## 2021-07-23 ENCOUNTER — Encounter: Payer: Self-pay | Admitting: Pulmonary Disease

## 2021-07-23 VITALS — BP 124/64 | HR 94 | Temp 98.1°F | Ht 71.0 in | Wt 126.4 lb

## 2021-07-23 DIAGNOSIS — C3492 Malignant neoplasm of unspecified part of left bronchus or lung: Secondary | ICD-10-CM

## 2021-07-23 DIAGNOSIS — F17200 Nicotine dependence, unspecified, uncomplicated: Secondary | ICD-10-CM | POA: Diagnosis not present

## 2021-07-23 DIAGNOSIS — R918 Other nonspecific abnormal finding of lung field: Secondary | ICD-10-CM

## 2021-07-23 DIAGNOSIS — J432 Centrilobular emphysema: Secondary | ICD-10-CM | POA: Diagnosis not present

## 2021-07-23 NOTE — Patient Instructions (Addendum)
Thank you for visiting Dr. [name] at [facility]. Today we recommend the following:  PFTs prior to next office visit   Return in about 3 months (around 10/23/2021) for with APP or Dr. [name].    Please do your part to reduce the spread of COVID-19.
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
## 2021-07-24 ENCOUNTER — Other Ambulatory Visit: Payer: Self-pay | Admitting: Physician Assistant

## 2021-07-24 ENCOUNTER — Other Ambulatory Visit: Payer: Self-pay | Admitting: Internal Medicine

## 2021-07-24 DIAGNOSIS — C3492 Malignant neoplasm of unspecified part of left bronchus or lung: Secondary | ICD-10-CM

## 2021-07-24 MED FILL — Dexamethasone Sodium Phosphate Inj 100 MG/10ML: INTRAMUSCULAR | Qty: 1 | Status: AC

## 2021-07-24 NOTE — Progress Notes (Signed)
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 The total time spent in the appointment was 35 minutes. Disclaimer: This note was dictated with voice recognition software. Similar sounding words can inadvertently be transcribed and may be missed upon review.  Eilleen Kempf, MD 07/27/21
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
## 2021-07-27 ENCOUNTER — Ambulatory Visit
Admission: RE | Admit: 2021-07-27 | Discharge: 2021-07-27 | Disposition: A | Discharge status: Home / Self Care | Payer: Medicare PPO | Source: Ambulatory Visit | Attending: Radiation Oncology | Admitting: Radiation Oncology

## 2021-07-27 ENCOUNTER — Inpatient Hospital Stay: Payer: Medicare PPO

## 2021-07-27 ENCOUNTER — Telehealth: Payer: Self-pay | Admitting: Medical Oncology

## 2021-07-27 ENCOUNTER — Other Ambulatory Visit: Payer: Self-pay | Admitting: Medical Oncology

## 2021-07-27 ENCOUNTER — Other Ambulatory Visit: Payer: Self-pay

## 2021-07-27 ENCOUNTER — Inpatient Hospital Stay (HOSPITAL_BASED_OUTPATIENT_CLINIC_OR_DEPARTMENT_OTHER): Payer: Medicare PPO | Admitting: Physician Assistant

## 2021-07-27 VITALS — BP 131/81 | HR 86 | Temp 97.9°F | Resp 16

## 2021-07-27 VITALS — BP 102/55 | HR 111 | Temp 97.2°F | Resp 18 | Ht 71.0 in | Wt 121.1 lb

## 2021-07-27 DIAGNOSIS — I119 Hypertensive heart disease without heart failure: Secondary | ICD-10-CM | POA: Diagnosis not present

## 2021-07-27 DIAGNOSIS — C3492 Malignant neoplasm of unspecified part of left bronchus or lung: Secondary | ICD-10-CM

## 2021-07-27 DIAGNOSIS — R591 Generalized enlarged lymph nodes: Secondary | ICD-10-CM | POA: Diagnosis not present

## 2021-07-27 DIAGNOSIS — D72823 Leukemoid reaction: Secondary | ICD-10-CM | POA: Diagnosis not present

## 2021-07-27 DIAGNOSIS — Z5111 Encounter for antineoplastic chemotherapy: Secondary | ICD-10-CM | POA: Diagnosis not present

## 2021-07-27 DIAGNOSIS — F17210 Nicotine dependence, cigarettes, uncomplicated: Secondary | ICD-10-CM | POA: Diagnosis not present

## 2021-07-27 DIAGNOSIS — I252 Old myocardial infarction: Secondary | ICD-10-CM | POA: Diagnosis not present

## 2021-07-27 DIAGNOSIS — C3412 Malignant neoplasm of upper lobe, left bronchus or lung: Secondary | ICD-10-CM | POA: Diagnosis not present

## 2021-07-27 DIAGNOSIS — Z510 Encounter for antineoplastic radiation therapy: Secondary | ICD-10-CM | POA: Diagnosis not present

## 2021-07-27 DIAGNOSIS — D72829 Elevated white blood cell count, unspecified: Secondary | ICD-10-CM | POA: Diagnosis not present

## 2021-07-27 DIAGNOSIS — Z79899 Other long term (current) drug therapy: Secondary | ICD-10-CM | POA: Diagnosis not present

## 2021-07-27 LAB — CMP (CANCER CENTER ONLY)
ALT: 29 U/L (ref 0–44)
AST: 16 U/L (ref 15–41)
Albumin: 2.9 g/dL — ABNORMAL LOW (ref 3.5–5.0)
Alkaline Phosphatase: 222 U/L — ABNORMAL HIGH (ref 38–126)
Anion gap: 13 (ref 5–15)
BUN: 10 mg/dL (ref 8–23)
CO2: 21 mmol/L — ABNORMAL LOW (ref 22–32)
Calcium: 9.3 mg/dL (ref 8.9–10.3)
Chloride: 107 mmol/L (ref 98–111)
Creatinine: 0.94 mg/dL (ref 0.61–1.24)
GFR, Estimated: >60 mL/min
Glucose, Bld: 190 mg/dL — ABNORMAL HIGH (ref 70–99)
Potassium: 4.2 mmol/L (ref 3.5–5.1)
Sodium: 141 mmol/L (ref 135–145)
Total Bilirubin: 0.7 mg/dL (ref 0.3–1.2)
Total Protein: 8.2 g/dL — ABNORMAL HIGH (ref 6.5–8.1)

## 2021-07-27 LAB — CBC WITH DIFFERENTIAL (CANCER CENTER ONLY)
Abs Immature Granulocytes: 1.17 K/uL — ABNORMAL HIGH (ref 0.00–0.07)
Basophils Absolute: 0.1 K/uL (ref 0.0–0.1)
Basophils Relative: 0 %
Eosinophils Absolute: 0.1 K/uL (ref 0.0–0.5)
Eosinophils Relative: 0 %
HCT: 35.4 % — ABNORMAL LOW (ref 39.0–52.0)
Hemoglobin: 11.6 g/dL — ABNORMAL LOW (ref 13.0–17.0)
Immature Granulocytes: 2 %
Lymphocytes Relative: 8 %
Lymphs Abs: 4.7 K/uL — ABNORMAL HIGH (ref 0.7–4.0)
MCH: 26.5 pg (ref 26.0–34.0)
MCHC: 32.8 g/dL (ref 30.0–36.0)
MCV: 81 fL (ref 80.0–100.0)
Monocytes Absolute: 2.2 K/uL — ABNORMAL HIGH (ref 0.1–1.0)
Monocytes Relative: 4 %
Neutro Abs: 51.1 K/uL — ABNORMAL HIGH (ref 1.7–7.7)
Neutrophils Relative %: 86 %
Platelet Count: 349 K/uL (ref 150–400)
RBC: 4.37 MIL/uL (ref 4.22–5.81)
RDW: 13.9 % (ref 11.5–15.5)
Smear Review: NORMAL
WBC Count: 59.4 K/uL (ref 4.0–10.5)
nRBC: 0 % (ref 0.0–0.2)

## 2021-07-27 MED ORDER — DIPHENHYDRAMINE HCL 50 MG/ML IJ SOLN
50.0000 mg | Freq: Once | INTRAMUSCULAR | Status: AC
  Administered 2021-07-27: 50 mg via INTRAVENOUS
  Filled 2021-07-27: qty 1

## 2021-07-27 MED ORDER — SODIUM CHLORIDE 0.9 % IV SOLN
10.0000 mg | Freq: Once | INTRAVENOUS | Status: AC
  Administered 2021-07-27: 10 mg via INTRAVENOUS
  Filled 2021-07-27: qty 10

## 2021-07-27 MED ORDER — FAMOTIDINE 20 MG IN NS 100 ML IVPB
20.0000 mg | Freq: Once | INTRAVENOUS | Status: AC
  Administered 2021-07-27: 20 mg via INTRAVENOUS
  Filled 2021-07-27: qty 100

## 2021-07-27 MED ORDER — DOXYCYCLINE HYCLATE 100 MG PO TABS: 100.0000 mg | ORAL_TABLET | Freq: Two times a day (BID) | ORAL | 0 refills | Status: DC

## 2021-07-27 MED ORDER — SODIUM CHLORIDE 0.9 % IV SOLN: Freq: Once | INTRAVENOUS | Status: AC

## 2021-07-27 MED ORDER — PALONOSETRON HCL INJECTION 0.25 MG/5ML
0.2500 mg | Freq: Once | INTRAVENOUS | Status: AC
Start: 2021-07-27 — End: 2021-07-27
  Administered 2021-07-27: 0.25 mg via INTRAVENOUS
  Filled 2021-07-27: qty 5

## 2021-07-27 MED ORDER — SODIUM CHLORIDE 0.9 % IV SOLN
145.8000 mg | Freq: Once | INTRAVENOUS | Status: AC
  Administered 2021-07-27: 150 mg via INTRAVENOUS
  Filled 2021-07-27: qty 15

## 2021-07-27 MED ORDER — SODIUM CHLORIDE 0.9 % IV SOLN
45.0000 mg/m2 | Freq: Once | INTRAVENOUS | Status: AC
  Administered 2021-07-27: 78 mg via INTRAVENOUS
  Filled 2021-07-27: qty 13

## 2021-07-27 MED ORDER — SODIUM CHLORIDE 0.9 % IV SOLN: 45.0000 mg/m2 | Freq: Once | INTRAVENOUS | Status: DC

## 2021-07-27 NOTE — Telephone Encounter (Signed)
CRITICAL VALUE STICKER  CRITICAL VALUE:WBC=59.4k  RECEIVER (on-site recipient of call):Paiten Boies  DATE & TIME NOTIFIED: 07/27/21 @0834   MESSENGER (representative from lab):Pam  MD NOTIFIED: Julien Nordmann  TIME OF NOTIFICATION:07/27/834  RESPONSE:  Cassie notified.

## 2021-07-27 NOTE — Progress Notes (Signed)
Okay to treat with elevated pulse and WBC per Cassie PA

## 2021-07-27 NOTE — Patient Instructions (Signed)
[facility]  Discharge Instructions: Thank you for choosing [facility] to provide your oncology and hematology care.   If you have a lab appointment with the [facility], please go directly to the [facility] and check in at the registration area.   Wear comfortable clothing and clothing appropriate for easy access to any Portacath or PICC line.   We strive to give you quality time with your provider. You may need to reschedule your appointment if you arrive late (15 or more minutes).  Arriving late affects you and other patients whose appointments are after yours.  Also, if you miss three or more appointments without notifying the office, you may be dismissed from the clinic at the provider's discretion.      For prescription refill requests, have your pharmacy contact our office and allow 72 hours for refills to be completed.    Today you received the following chemotherapy and/or immunotherapy agents : Paclitaxel, Carboplatin      To help prevent nausea and vomiting after your treatment, we encourage you to take your nausea medication as directed.  BELOW ARE SYMPTOMS THAT SHOULD BE REPORTED IMMEDIATELY: *FEVER GREATER THAN 100.4 F (38 C) OR HIGHER *CHILLS OR SWEATING *NAUSEA AND VOMITING THAT IS NOT CONTROLLED WITH YOUR NAUSEA MEDICATION *UNUSUAL SHORTNESS OF BREATH *UNUSUAL BRUISING OR BLEEDING *URINARY PROBLEMS (pain or burning when urinating, or frequent urination) *BOWEL PROBLEMS (unusual diarrhea, constipation, pain near the anus) TENDERNESS IN MOUTH AND THROAT WITH OR WITHOUT PRESENCE OF ULCERS (sore throat, sores in mouth, or a toothache) UNUSUAL RASH, SWELLING OR PAIN  UNUSUAL VAGINAL DISCHARGE OR ITCHING   Items with * indicate a potential emergency and should be followed up as soon as possible or go to the Emergency Department if any problems should occur.  Please show the CHEMOTHERAPY ALERT CARD or IMMUNOTHERAPY ALERT CARD  at check-in to the Emergency Department and triage nurse.  Should you have questions after your visit or need to cancel or reschedule your appointment, please contact [facility]  Dept: [phone]  and follow the prompts.  Office hours are 8:00 a.m. to 4:30 p.m. Monday - Friday. Please note that voicemails left after 4:00 p.m. may not be returned until the following business day.  We are closed weekends and major holidays. You have access to a nurse at all times for urgent questions. Please call the main number to the clinic Dept: [phone] and follow the prompts.   For any non-urgent questions, you may also contact your provider using MyChart. We now offer e-Visits for anyone 10 and older to request care online for non-urgent symptoms. For details visit [email or web address].   Also download the MyChart app! Go to the app store, search "MyChart", open the app, select [facility], and log in with your MyChart username and password.  Due to Covid, a mask is required upon entering the hospital/clinic. If you do not have a mask, one will be given to you upon arrival. For doctor visits, patients may have 1 support person aged 23 or older with them. For treatment visits, patients cannot have anyone with them due to current Covid guidelines and our immunocompromised population.   Paclitaxel injection What is this medication? PACLITAXEL (PAK li TAX el) is a chemotherapy drug. It targets fast dividing cells, like cancer cells, and causes these cells to die. This medicine is used to treat ovarian cancer, breast cancer, lung cancer, Kaposi's sarcoma, andother cancers. This medicine  may be used for other purposes; ask your health care provider orpharmacist if you have questions. COMMON BRAND NAME(S): Onxol, Taxol What should I tell my care team before I take this medication? They need to know if you have any of these conditions: history of irregular heartbeat liver  disease low blood counts, like low white cell, platelet, or red cell counts lung or breathing disease, like asthma tingling of the fingers or toes, or other nerve disorder an unusual or allergic reaction to paclitaxel, alcohol, polyoxyethylated castor oil, other chemotherapy, other medicines, foods, dyes, or preservatives pregnant or trying to get pregnant breast-feeding How should I use this medication? This drug is given as an infusion into a vein. It is administered in a hospitalor clinic by a specially trained health care professional. Talk to your pediatrician regarding the use of this medicine in children.Special care may be needed. Overdosage: If you think you have taken too much of this medicine contact apoison control center or emergency room at once. NOTE: This medicine is only for you. Do not share this medicine with others. What if I miss a dose? It is important not to miss your dose. Call your doctor or health careprofessional if you are unable to keep an appointment. What may interact with this medication? Do not take this medicine with any of the following medications: live virus vaccines This medicine may also interact with the following medications: antiviral medicines for hepatitis, HIV or AIDS certain antibiotics like erythromycin and clarithromycin certain medicines for fungal infections like ketoconazole and itraconazole certain medicines for seizures like carbamazepine, phenobarbital, phenytoin gemfibrozil nefazodone rifampin St. John's wort This list may not describe all possible interactions. Give your health care provider a list of all the medicines, herbs, non-prescription drugs, or dietary supplements you use. Also tell them if you smoke, drink alcohol, or use illegaldrugs. Some items may interact with your medicine. What should I watch for while using this medication? Your condition will be monitored carefully while you are receiving this medicine. You will  need important blood work done while you are taking thismedicine. This medicine can cause serious allergic reactions. To reduce your risk you will need to take other medicine(s) before treatment with this medicine. If you experience allergic reactions like skin rash, itching or hives, swelling of theface, lips, or tongue, tell your doctor or health care professional right away. In some cases, you may be given additional medicines to help with side effects.Follow all directions for their use. This drug may make you feel generally unwell. This is not uncommon, as chemotherapy can affect healthy cells as well as cancer cells. Report any side effects. Continue your course of treatment even though you feel ill unless yourdoctor tells you to stop. Call your doctor or health care professional for advice if you get a fever, chills or sore throat, or other symptoms of a cold or flu. Do not treat yourself. This drug decreases your body's ability to fight infections. Try toavoid being around people who are sick. This medicine may increase your risk to bruise or bleed. Call your doctor orhealth care professional if you notice any unusual bleeding. Be careful brushing and flossing your teeth or using a toothpick because you may get an infection or bleed more easily. If you have any dental work done,tell your dentist you are receiving this medicine. Avoid taking products that contain aspirin, acetaminophen, ibuprofen, naproxen, or ketoprofen unless instructed by your doctor. These medicines may hide afever. Do not become pregnant while taking  this medicine. Women should inform their doctor if they wish to become pregnant or think they might be pregnant. There is a potential for serious side effects to an unborn child. Talk to your health care professional or pharmacist for more information. Do not breast-feed aninfant while taking this medicine. Men are advised not to father a child while receiving this medicine. This  product may contain alcohol. Ask your pharmacist or healthcare provider if this medicine contains alcohol. Be sure to tell all healthcare providers you are taking this medicine. Certain medicines, like metronidazole and disulfiram, can cause an unpleasant reaction when taken with alcohol. The reaction includes flushing, headache, nausea, vomiting, sweating, and increased thirst. Thereaction can last from 30 minutes to several hours. What side effects may I notice from receiving this medication? Side effects that you should report to your doctor or health care professionalas soon as possible: allergic reactions like skin rash, itching or hives, swelling of the face, lips, or tongue breathing problems changes in vision fast, irregular heartbeat high or low blood pressure mouth sores pain, tingling, numbness in the hands or feet signs of decreased platelets or bleeding - bruising, pinpoint red spots on the skin, black, tarry stools, blood in the urine signs of decreased red blood cells - unusually weak or tired, feeling faint or lightheaded, falls signs of infection - fever or chills, cough, sore throat, pain or difficulty passing urine signs and symptoms of liver injury like dark yellow or brown urine; general ill feeling or flu-like symptoms; light-colored stools; loss of appetite; nausea; right upper belly pain; unusually weak or tired; yellowing of the eyes or skin swelling of the ankles, feet, hands unusually slow heartbeat Side effects that usually do not require medical attention (report to yourdoctor or health care professional if they continue or are bothersome): diarrhea hair loss loss of appetite muscle or joint pain nausea, vomiting pain, redness, or irritation at site where injected tiredness This list may not describe all possible side effects. Call your doctor for medical advice about side effects. You may report side effects to FDA at1-800-FDA-1088. Where should I keep my  medication? This drug is given in a hospital or clinic and will not be stored at home. NOTE: This sheet is a summary. It may not cover all possible information. If you have questions about this medicine, talk to your doctor, pharmacist, orhealth care provider.  2022 Elsevier/Gold Standard (2019-10-17 13:37:23)  Carboplatin injection What is this medication? CARBOPLATIN (KAR boe pla tin) is a chemotherapy drug. It targets fast dividing cells, like cancer cells, and causes these cells to die. This medicine is usedto treat ovarian cancer and many other cancers. This medicine may be used for other purposes; ask your health care provider orpharmacist if you have questions. COMMON BRAND NAME(S): Paraplatin What should I tell my care team before I take this medication? They need to know if you have any of these conditions: blood disorders hearing problems kidney disease recent or ongoing radiation therapy an unusual or allergic reaction to carboplatin, cisplatin, other chemotherapy, other medicines, foods, dyes, or preservatives pregnant or trying to get pregnant breast-feeding How should I use this medication? This drug is usually given as an infusion into a vein. It is administered in [facility] or clinic by a specially trained health care professional. Talk to your pediatrician regarding the use of this medicine in children.Special care may be needed. Overdosage: If you think you have taken too much of this medicine contact apoison control center or emergency  room at once. NOTE: This medicine is only for you. Do not share this medicine with others. What if I miss a dose? It is important not to miss a dose. Call your doctor or health careprofessional if you are unable to keep an appointment. What may interact with this medication? medicines for seizures medicines to increase blood counts like filgrastim, pegfilgrastim, sargramostim some antibiotics like amikacin, gentamicin, neomycin,  streptomycin, tobramycin vaccines Talk to your doctor or health care professional before taking any of thesemedicines: acetaminophen aspirin ibuprofen ketoprofen naproxen This list may not describe all possible interactions. Give your health care provider a list of all the medicines, herbs, non-prescription drugs, or dietary supplements you use. Also tell them if you smoke, drink alcohol, or use illegaldrugs. Some items may interact with your medicine. What should I watch for while using this medication? Your condition will be monitored carefully while you are receiving this medicine. You will need important blood work done while you are taking thismedicine. This drug may make you feel generally unwell. This is not uncommon, as chemotherapy can affect healthy cells as well as cancer cells. Report any side effects. Continue your course of treatment even though you feel ill unless yourdoctor tells you to stop. In some cases, you may be given additional medicines to help with side effects.Follow all directions for their use. Call your doctor or health care professional for advice if you get a fever, chills or sore throat, or other symptoms of a cold or flu. Do not treat yourself. This drug decreases your body's ability to fight infections. Try toavoid being around people who are sick. This medicine may increase your risk to bruise or bleed. Call your doctor orhealth care professional if you notice any unusual bleeding. Be careful brushing and flossing your teeth or using a toothpick because you may get an infection or bleed more easily. If you have any dental work done,tell your dentist you are receiving this medicine. Avoid taking products that contain aspirin, acetaminophen, ibuprofen, naproxen, or ketoprofen unless instructed by your doctor. These medicines may hide afever. Do not become pregnant while taking this medicine. Women should inform their doctor if they wish to become pregnant or think  they might be pregnant. There is a potential for serious side effects to an unborn child. Talk to your health care professional or pharmacist for more information. Do not breast-feed aninfant while taking this medicine. What side effects may I notice from receiving this medication? Side effects that you should report to your doctor or health care professionalas soon as possible: allergic reactions like skin rash, itching or hives, swelling of the face, lips, or tongue signs of infection - fever or chills, cough, sore throat, pain or difficulty passing urine signs of decreased platelets or bleeding - bruising, pinpoint red spots on the skin, black, tarry stools, nosebleeds signs of decreased red blood cells - unusually weak or tired, fainting spells, lightheadedness breathing problems changes in hearing changes in vision chest pain high blood pressure low blood counts - This drug may decrease the number of white blood cells, red blood cells and platelets. You may be at increased risk for infections and bleeding. nausea and vomiting pain, swelling, redness or irritation at the injection site pain, tingling, numbness in the hands or feet problems with balance, talking, walking trouble passing urine or change in the amount of urine Side effects that usually do not require medical attention (report to yourdoctor or health care professional if they continue or are bothersome):  hair loss loss of appetite metallic taste in the mouth or changes in taste This list may not describe all possible side effects. Call your doctor for medical advice about side effects. You may report side effects to FDA at1-800-FDA-1088. Where should I keep my medication? This drug is given in a hospital or clinic and will not be stored at home. NOTE: This sheet is a summary. It may not cover all possible information. If you have questions about this medicine, talk to your doctor, pharmacist, orhealth care provider.  2022  Elsevier/Gold Standard (2008-02-20 14:38:05)

## 2021-07-28 ENCOUNTER — Ambulatory Visit (HOSPITAL_COMMUNITY): Admission: RE | Admit: 2021-07-28 | Payer: Medicare PPO | Source: Ambulatory Visit

## 2021-07-28 ENCOUNTER — Ambulatory Visit
Admission: RE | Admit: 2021-07-28 | Discharge: 2021-07-28 | Disposition: A | Discharge status: Home / Self Care | Payer: Medicare PPO | Source: Ambulatory Visit | Attending: Radiation Oncology | Admitting: Radiation Oncology

## 2021-07-28 VITALS — BP 114/71 | HR 99 | Resp 18

## 2021-07-28 DIAGNOSIS — C3412 Malignant neoplasm of upper lobe, left bronchus or lung: Secondary | ICD-10-CM | POA: Diagnosis not present

## 2021-07-28 DIAGNOSIS — C3492 Malignant neoplasm of unspecified part of left bronchus or lung: Secondary | ICD-10-CM

## 2021-07-28 DIAGNOSIS — Z510 Encounter for antineoplastic radiation therapy: Secondary | ICD-10-CM | POA: Diagnosis not present

## 2021-07-28 MED ORDER — SONAFINE EX EMUL
1.0000 "application " | Freq: Once | CUTANEOUS | Status: AC
  Administered 2021-07-28: 1 via TOPICAL

## 2021-07-28 NOTE — Progress Notes (Signed)
Patient was brought around to the clinic from treatment machine due to concerns for confusion.  Upon assessment patient was A/O x4, vitals were normal.  Patient was given radiation education with teach back.  We discussed his upcoming appointments.  Patient ambulated out of the clinic without difficulty.  BP 114/71   Pulse 99   Resp 18   SpO2 100%    [place] M. Leonie Green, BSN

## 2021-07-28 NOTE — Progress Notes (Signed)
Pt here for patient teaching.  Pt given Radiation and You booklet, skin care instructions, and Sonafine.  Reviewed areas of pertinence such as fatigue, hair loss, skin changes, throat changes, cough, and shortness of breath . Pt able to give teach back of to pat skin and use unscented/gentle soap,apply Sonafine bid and avoid applying anything to skin within 4 hours of treatment. Pt verbalizes understanding of information given and will contact nursing with any questions or concerns.     [email or web address]  Senaya Dicenso M. Abbygale Lapid RN, BSN       

## 2021-07-29 ENCOUNTER — Other Ambulatory Visit: Payer: Self-pay

## 2021-07-29 ENCOUNTER — Ambulatory Visit
Admission: RE | Admit: 2021-07-29 | Discharge: 2021-07-29 | Disposition: A | Discharge status: Home / Self Care | Payer: Medicare PPO | Source: Ambulatory Visit | Attending: Radiation Oncology | Admitting: Radiation Oncology

## 2021-07-29 ENCOUNTER — Telehealth: Payer: Self-pay | Admitting: Physician Assistant

## 2021-07-29 ENCOUNTER — Other Ambulatory Visit: Payer: Self-pay | Admitting: Internal Medicine

## 2021-07-29 DIAGNOSIS — Z510 Encounter for antineoplastic radiation therapy: Secondary | ICD-10-CM | POA: Diagnosis not present

## 2021-07-29 DIAGNOSIS — C3412 Malignant neoplasm of upper lobe, left bronchus or lung: Secondary | ICD-10-CM | POA: Diagnosis not present

## 2021-07-29 NOTE — Telephone Encounter (Signed)
Scheduled appointment per 08/30 los. Patient is aware.

## 2021-07-30 ENCOUNTER — Emergency Department (HOSPITAL_COMMUNITY)
Admission: EM | Admit: 2021-07-30 | Discharge: 2021-07-30 | Disposition: A | Discharge status: Home / Self Care | Payer: Medicare PPO | Attending: Emergency Medicine | Admitting: Emergency Medicine

## 2021-07-30 ENCOUNTER — Encounter (HOSPITAL_COMMUNITY): Payer: Self-pay | Admitting: *Deleted

## 2021-07-30 ENCOUNTER — Inpatient Hospital Stay: Payer: Medicare PPO | Attending: Internal Medicine

## 2021-07-30 ENCOUNTER — Other Ambulatory Visit: Payer: Self-pay

## 2021-07-30 ENCOUNTER — Ambulatory Visit
Admission: RE | Admit: 2021-07-30 | Discharge: 2021-07-30 | Disposition: A | Discharge status: Home / Self Care | Payer: Medicare PPO | Source: Ambulatory Visit | Attending: Radiation Oncology | Admitting: Radiation Oncology

## 2021-07-30 ENCOUNTER — Telehealth: Payer: Self-pay

## 2021-07-30 ENCOUNTER — Emergency Department (HOSPITAL_COMMUNITY): Payer: Medicare PPO

## 2021-07-30 DIAGNOSIS — Z79899 Other long term (current) drug therapy: Secondary | ICD-10-CM | POA: Insufficient documentation

## 2021-07-30 DIAGNOSIS — E1165 Type 2 diabetes mellitus with hyperglycemia: Secondary | ICD-10-CM | POA: Insufficient documentation

## 2021-07-30 DIAGNOSIS — R739 Hyperglycemia, unspecified: Secondary | ICD-10-CM | POA: Diagnosis present

## 2021-07-30 DIAGNOSIS — R000 Tachycardia, unspecified: Secondary | ICD-10-CM | POA: Diagnosis not present

## 2021-07-30 DIAGNOSIS — C3492 Malignant neoplasm of unspecified part of left bronchus or lung: Secondary | ICD-10-CM

## 2021-07-30 DIAGNOSIS — Z923 Personal history of irradiation: Secondary | ICD-10-CM | POA: Insufficient documentation

## 2021-07-30 DIAGNOSIS — C3412 Malignant neoplasm of upper lobe, left bronchus or lung: Secondary | ICD-10-CM | POA: Insufficient documentation

## 2021-07-30 DIAGNOSIS — Z7902 Long term (current) use of antithrombotics/antiplatelets: Secondary | ICD-10-CM | POA: Diagnosis not present

## 2021-07-30 DIAGNOSIS — Z85118 Personal history of other malignant neoplasm of bronchus and lung: Secondary | ICD-10-CM | POA: Diagnosis not present

## 2021-07-30 DIAGNOSIS — I10 Essential (primary) hypertension: Secondary | ICD-10-CM | POA: Insufficient documentation

## 2021-07-30 DIAGNOSIS — R1031 Right lower quadrant pain: Secondary | ICD-10-CM | POA: Diagnosis not present

## 2021-07-30 DIAGNOSIS — Z510 Encounter for antineoplastic radiation therapy: Secondary | ICD-10-CM | POA: Insufficient documentation

## 2021-07-30 DIAGNOSIS — Z7984 Long term (current) use of oral hypoglycemic drugs: Secondary | ICD-10-CM | POA: Insufficient documentation

## 2021-07-30 DIAGNOSIS — I2510 Atherosclerotic heart disease of native coronary artery without angina pectoris: Secondary | ICD-10-CM | POA: Insufficient documentation

## 2021-07-30 DIAGNOSIS — J449 Chronic obstructive pulmonary disease, unspecified: Secondary | ICD-10-CM | POA: Insufficient documentation

## 2021-07-30 DIAGNOSIS — Z955 Presence of coronary angioplasty implant and graft: Secondary | ICD-10-CM | POA: Insufficient documentation

## 2021-07-30 DIAGNOSIS — Z5111 Encounter for antineoplastic chemotherapy: Secondary | ICD-10-CM | POA: Insufficient documentation

## 2021-07-30 DIAGNOSIS — I252 Old myocardial infarction: Secondary | ICD-10-CM | POA: Insufficient documentation

## 2021-07-30 DIAGNOSIS — R109 Unspecified abdominal pain: Secondary | ICD-10-CM | POA: Diagnosis not present

## 2021-07-30 DIAGNOSIS — F17210 Nicotine dependence, cigarettes, uncomplicated: Secondary | ICD-10-CM | POA: Diagnosis not present

## 2021-07-30 LAB — CBC WITH DIFFERENTIAL/PLATELET
Abs Immature Granulocytes: 2.58 10*3/uL — ABNORMAL HIGH (ref 0.00–0.07)
Basophils Absolute: 0 10*3/uL (ref 0.0–0.1)
Basophils Relative: 0 %
Eosinophils Absolute: 0 10*3/uL (ref 0.0–0.5)
Eosinophils Relative: 0 %
HCT: 33.9 % — ABNORMAL LOW (ref 39.0–52.0)
Hemoglobin: 10.7 g/dL — ABNORMAL LOW (ref 13.0–17.0)
Immature Granulocytes: 4 %
Lymphocytes Relative: 2 %
Lymphs Abs: 1.3 10*3/uL (ref 0.7–4.0)
MCH: 26.7 pg (ref 26.0–34.0)
MCHC: 31.6 g/dL (ref 30.0–36.0)
MCV: 84.5 fL (ref 80.0–100.0)
Monocytes Absolute: 1.1 10*3/uL — ABNORMAL HIGH (ref 0.1–1.0)
Monocytes Relative: 2 %
Neutro Abs: 58.9 10*3/uL — ABNORMAL HIGH (ref 1.7–7.7)
Neutrophils Relative %: 92 %
Platelets: 332 10*3/uL (ref 150–400)
RBC: 4.01 MIL/uL — ABNORMAL LOW (ref 4.22–5.81)
RDW: 13.8 % (ref 11.5–15.5)
WBC: 63.9 10*3/uL (ref 4.0–10.5)
nRBC: 0 % (ref 0.0–0.2)

## 2021-07-30 LAB — CBC WITH DIFFERENTIAL (CANCER CENTER ONLY)
Abs Immature Granulocytes: 2.63 10*3/uL — ABNORMAL HIGH (ref 0.00–0.07)
Basophils Absolute: 0.1 10*3/uL (ref 0.0–0.1)
Basophils Relative: 0 %
Eosinophils Absolute: 0 10*3/uL (ref 0.0–0.5)
Eosinophils Relative: 0 %
HCT: 31.9 % — ABNORMAL LOW (ref 39.0–52.0)
Hemoglobin: 10.2 g/dL — ABNORMAL LOW (ref 13.0–17.0)
Immature Granulocytes: 4 %
Lymphocytes Relative: 2 %
Lymphs Abs: 1.5 10*3/uL (ref 0.7–4.0)
MCH: 26.6 pg (ref 26.0–34.0)
MCHC: 32 g/dL (ref 30.0–36.0)
MCV: 83.3 fL (ref 80.0–100.0)
Monocytes Absolute: 1.2 10*3/uL — ABNORMAL HIGH (ref 0.1–1.0)
Monocytes Relative: 2 %
Neutro Abs: 59 10*3/uL — ABNORMAL HIGH (ref 1.7–7.7)
Neutrophils Relative %: 92 %
Platelet Count: 324 10*3/uL (ref 150–400)
RBC: 3.83 MIL/uL — ABNORMAL LOW (ref 4.22–5.81)
RDW: 13.9 % (ref 11.5–15.5)
WBC Count: 64.3 10*3/uL (ref 4.0–10.5)
nRBC: 0 % (ref 0.0–0.2)

## 2021-07-30 LAB — URINALYSIS, ROUTINE W REFLEX MICROSCOPIC
Bacteria, UA: NONE SEEN
Bilirubin Urine: NEGATIVE
Glucose, UA: >=500 mg/dL — AB
Hgb urine dipstick: NEGATIVE
Ketones, ur: NEGATIVE mg/dL
Leukocytes,Ua: NEGATIVE
Nitrite: NEGATIVE
Protein, ur: NEGATIVE mg/dL
Specific Gravity, Urine: 1.023 (ref 1.005–1.030)
pH: 5 (ref 5.0–8.0)

## 2021-07-30 LAB — I-STAT VENOUS BLOOD GAS, ED
Acid-base deficit: 1 mmol/L (ref 0.0–2.0)
Bicarbonate: 24.5 mmol/L (ref 20.0–28.0)
Calcium, Ion: 1.22 mmol/L (ref 1.15–1.40)
HCT: 36 % — ABNORMAL LOW (ref 39.0–52.0)
Hemoglobin: 12.2 g/dL — ABNORMAL LOW (ref 13.0–17.0)
O2 Saturation: 54 %
Potassium: 4.4 mmol/L (ref 3.5–5.1)
Sodium: 133 mmol/L — ABNORMAL LOW (ref 135–145)
TCO2: 26 mmol/L (ref 22–32)
pCO2, Ven: 44.9 mmHg (ref 44.0–60.0)
pH, Ven: 7.344 (ref 7.250–7.430)
pO2, Ven: 30 mmHg — CL (ref 32.0–45.0)

## 2021-07-30 LAB — CMP (CANCER CENTER ONLY)
ALT: 27 U/L (ref 0–44)
AST: 14 U/L — ABNORMAL LOW (ref 15–41)
Albumin: 2.9 g/dL — ABNORMAL LOW (ref 3.5–5.0)
Alkaline Phosphatase: 233 U/L — ABNORMAL HIGH (ref 38–126)
Anion gap: 15 (ref 5–15)
BUN: 29 mg/dL — ABNORMAL HIGH (ref 8–23)
CO2: 20 mmol/L — ABNORMAL LOW (ref 22–32)
Calcium: 9.7 mg/dL (ref 8.9–10.3)
Chloride: 99 mmol/L (ref 98–111)
Creatinine: 1.78 mg/dL — ABNORMAL HIGH (ref 0.61–1.24)
GFR, Estimated: 39 mL/min — ABNORMAL LOW (ref >60)
Glucose, Bld: 672 mg/dL (ref 70–99)
Potassium: 4.7 mmol/L (ref 3.5–5.1)
Sodium: 134 mmol/L — ABNORMAL LOW (ref 135–145)
Total Bilirubin: 0.7 mg/dL (ref 0.3–1.2)
Total Protein: 8.1 g/dL (ref 6.5–8.1)

## 2021-07-30 LAB — COMPREHENSIVE METABOLIC PANEL
ALT: 29 U/L (ref 0–44)
AST: 17 U/L (ref 15–41)
Albumin: 2.9 g/dL — ABNORMAL LOW (ref 3.5–5.0)
Alkaline Phosphatase: 239 U/L — ABNORMAL HIGH (ref 38–126)
Anion gap: 10 (ref 5–15)
BUN: 26 mg/dL — ABNORMAL HIGH (ref 8–23)
CO2: 23 mmol/L (ref 22–32)
Calcium: 9 mg/dL (ref 8.9–10.3)
Chloride: 97 mmol/L — ABNORMAL LOW (ref 98–111)
Creatinine, Ser: 1.45 mg/dL — ABNORMAL HIGH (ref 0.61–1.24)
GFR, Estimated: 50 mL/min — ABNORMAL LOW (ref >60)
Glucose, Bld: 537 mg/dL (ref 70–99)
Potassium: 4.4 mmol/L (ref 3.5–5.1)
Sodium: 130 mmol/L — ABNORMAL LOW (ref 135–145)
Total Bilirubin: 0.7 mg/dL (ref 0.3–1.2)
Total Protein: 8.1 g/dL (ref 6.5–8.1)

## 2021-07-30 LAB — CBG MONITORING, ED
Glucose-Capillary: 509 mg/dL (ref 70–99)
Glucose-Capillary: 375 mg/dL — ABNORMAL HIGH (ref 70–99)

## 2021-07-30 LAB — OSMOLALITY: Osmolality: 315 mOsm/kg — ABNORMAL HIGH (ref 275–295)

## 2021-07-30 LAB — LIPASE, BLOOD: Lipase: 68 U/L — ABNORMAL HIGH (ref 11–51)

## 2021-07-30 MED ORDER — IOHEXOL 350 MG/ML SOLN
75.0000 mL | Freq: Once | INTRAVENOUS | Status: AC | PRN
  Administered 2021-07-30: 75 mL via INTRAVENOUS

## 2021-07-30 MED ORDER — SODIUM CHLORIDE 0.9 % IV BOLUS
500.0000 mL | Freq: Once | INTRAVENOUS | Status: AC
  Administered 2021-07-30: 500 mL via INTRAVENOUS

## 2021-07-30 NOTE — Telephone Encounter (Signed)
CRITICAL VALUE STICKER  CRITICAL VALUE: WBC = 64.3  RECEIVER (on-site recipient of call): Yetta Glassman, CMA  DATE & TIME NOTIFIED: 07/30/21 at 9:12am  MESSENGER (representative from lab): Verdis Frederickson  MD NOTIFIED: Dr. Julien Nordmann  TIME OF NOTIFICATION: 07/30/21 at 9:15am  RESPONSE: Notification given to Dr. Julien Nordmann who advised he will continue to monitor.

## 2021-07-30 NOTE — Telephone Encounter (Signed)
CRITICAL VALUE STICKER  CRITICAL VALUE: Glucose 672  RECEIVER (on-site recipient of call):Frank Hart  DATE & TIME NOTIFIED: 07/30/2021  MESSENGER (representative from lab):  MD NOTIFIED: Julien Nordmann  TIME OF NOTIFICATION:0955  RESPONSE:  Send pt to ED.  LVM for Frank Hart to call me back. Per Dr. Julien Nordmann send pt to ED.  LVM with these instructions on Frank Hart to send pt to ED.  I spoke to pt and he is at his PCP office for his S.O appt. I  spoke to Frank Hart at provider's office and she said she will have Dr Frank Hart see pt today. Labs faxed to PCP.

## 2021-07-30 NOTE — ED Provider Notes (Signed)
Care of the patient received from [facility]  Please see her note for full HPI.  In short, 75 year old male with known left lung carcinoma currently undergoing treatment presented to the ER with an elevated glucose and complaints of red material in his stool, unsure if it was blood.  His work-up in the ED showed a CBG initially of 537, with a white count of 63.9 which does appear to be more or less at baseline.  Creatinine was at baseline.  LFTs normal, lipase slightly elevated but not consistent with pancreatitis.  No evidence of DKA.  Patient received 500 cc of normal saline, CBG improved to 375.  Prior team had ordered a CT of the abdomen and pelvis to better evaluate questionable red material in his stool.  Prior team perform rectal exam given the patient is on chemotherapy.  I personally reviewed the CT of the abdomen which shows an unchanged left necrotic mass consistent with his history of prior lung cancer.  No acute abdominal findings were noted.  There was some lytic lesions noted in the right bone which could be metastases.  Findings discussed with the patient who was overall reassuring.  He is well-appearing.  Patient was educated to avoid high sugar ensures, cookies which is what he had eaten prior.  Encouraged follow-up with oncologist.  We discussed return precautions.  He was understanding and is agreeable.  Stable for discharge.   Results for orders placed or performed during the hospital encounter of 07/30/21  CBC with Differential  Result Value Ref Range   WBC 63.9 (HH) 4.0 - 10.5 K/uL   RBC 4.01 (L) 4.22 - 5.81 MIL/uL   Hemoglobin 10.7 (L) 13.0 - 17.0 g/dL   HCT 33.9 (L) 39.0 - 52.0 %   MCV 84.5 80.0 - 100.0 fL   MCH 26.7 26.0 - 34.0 pg   MCHC 31.6 30.0 - 36.0 g/dL   RDW 13.8 11.5 - 15.5 %   Platelets 332 150 - 400 K/uL   nRBC 0.0 0.0 - 0.2 %   Neutrophils Relative % 92 %   Neutro Abs 58.9 (H) 1.7 - 7.7 K/uL   Lymphocytes Relative 2 %   Lymphs Abs 1.3 0.7 - 4.0 K/uL    Monocytes Relative 2 %   Monocytes Absolute 1.1 (H) 0.1 - 1.0 K/uL   Eosinophils Relative 0 %   Eosinophils Absolute 0.0 0.0 - 0.5 K/uL   Basophils Relative 0 %   Basophils Absolute 0.0 0.0 - 0.1 K/uL   WBC Morphology MORPHOLOGY UNREMARKABLE    RBC Morphology MORPHOLOGY UNREMARKABLE    Smear Review MORPHOLOGY UNREMARKABLE    Immature Granulocytes 4 %   Abs Immature Granulocytes 2.58 (H) 0.00 - 0.07 K/uL  Comprehensive metabolic panel  Result Value Ref Range   Sodium 130 (L) 135 - 145 mmol/L   Potassium 4.4 3.5 - 5.1 mmol/L   Chloride 97 (L) 98 - 111 mmol/L   CO2 23 22 - 32 mmol/L   Glucose, Bld 537 (HH) 70 - 99 mg/dL   BUN 26 (H) 8 - 23 mg/dL   Creatinine, Ser 1.45 (H) 0.61 - 1.24 mg/dL   Calcium 9.0 8.9 - 10.3 mg/dL   Total Protein 8.1 6.5 - 8.1 g/dL   Albumin 2.9 (L) 3.5 - 5.0 g/dL   AST 17 15 - 41 U/L   ALT 29 0 - 44 U/L   Alkaline Phosphatase 239 (H) 38 - 126 U/L   Total Bilirubin 0.7 0.3 - 1.2 mg/dL  GFR, Estimated 50 (L) >60 mL/min   Anion gap 10 5 - 15  Lipase, blood  Result Value Ref Range   Lipase 68 (H) 11 - 51 U/L  Urinalysis, Routine w reflex microscopic  Result Value Ref Range   Color, Urine YELLOW YELLOW   APPearance HAZY (A) CLEAR   Specific Gravity, Urine 1.023 1.005 - 1.030   pH 5.0 5.0 - 8.0   Glucose, UA >=500 (A) NEGATIVE mg/dL   Hgb urine dipstick NEGATIVE NEGATIVE   Bilirubin Urine NEGATIVE NEGATIVE   Ketones, ur NEGATIVE NEGATIVE mg/dL   Protein, ur NEGATIVE NEGATIVE mg/dL   Nitrite NEGATIVE NEGATIVE   Leukocytes,Ua NEGATIVE NEGATIVE   WBC, UA 0-5 0 - 5 WBC/hpf   Bacteria, UA NONE SEEN NONE SEEN   Squamous Epithelial / LPF 0-5 0 - 5   Mucus PRESENT    Hyaline Casts, UA PRESENT   Osmolality  Result Value Ref Range   Osmolality 315 (H) 275 - 295 mOsm/kg  CBG monitoring, ED  Result Value Ref Range   Glucose-Capillary 509 (HH) 70 - 99 mg/dL   Comment 1 Notify RN   I-Stat venous blood gas, [facility] ED)  Result Value Ref Range   pH, Ven 7.344  7.250 - 7.430   pCO2, Ven 44.9 44.0 - 60.0 mmHg   pO2, Ven 30.0 (LL) 32.0 - 45.0 mmHg   Bicarbonate 24.5 20.0 - 28.0 mmol/L   TCO2 26 22 - 32 mmol/L   O2 Saturation 54.0 %   Acid-base deficit 1.0 0.0 - 2.0 mmol/L   Sodium 133 (L) 135 - 145 mmol/L   Potassium 4.4 3.5 - 5.1 mmol/L   Calcium, Ion 1.22 1.15 - 1.40 mmol/L   HCT 36.0 (L) 39.0 - 52.0 %   Hemoglobin 12.2 (L) 13.0 - 17.0 g/dL   Sample type VENOUS   CBG monitoring, ED  Result Value Ref Range   Glucose-Capillary 375 (H) 70 - 99 mg/dL   MR BRAIN W WO CONTRAST  Result Date: 07/01/2021 CLINICAL DATA:  Non-small cell lung cancer.  Staging. EXAM: MRI HEAD WITHOUT AND WITH CONTRAST TECHNIQUE: Multiplanar, multiecho pulse sequences of the brain and surrounding structures were obtained without and with intravenous contrast. CONTRAST:  51mL GADAVIST GADOBUTROL 1 MMOL/ML IV SOLN COMPARISON:  None. FINDINGS: Brain: Diffusion imaging does not show any acute or subacute infarction. There is some artifactual signal within the pons midbrain junction on the axial diffusion imaging. There are mild chronic small-vessel ischemic changes of the pons. No focal cerebellar finding. Cerebral hemispheres show age related volume loss with mild chronic small-vessel change of the deep and subcortical white matter. No cortical or large vessel territory infarction. No evidence of primary or metastatic mass lesion. No hemorrhage, hydrocephalus or extra-axial collection. Vascular: Major vessels at the base of the brain show flow. Skull and upper cervical spine: Somewhat heterogeneous marrow pattern, but without strong suspicion of bony metastatic disease. Sinuses/Orbits: Clear/normal Other: None IMPRESSION: No evidence of metastatic disease. Chronic small-vessel ischemic changes of the pons and cerebral hemispheric white matter. Electronically Signed   By: Nelson Chimes M.D.   On: 07/01/2021 14:10   CT Abdomen Pelvis W Contrast  Result Date: 07/30/2021 CLINICAL DATA:   Right-sided abdominal pain EXAM: CT ABDOMEN AND PELVIS WITH CONTRAST TECHNIQUE: Multidetector CT imaging of the abdomen and pelvis was performed using the standard protocol following bolus administration of intravenous contrast. CONTRAST:  60mL OMNIPAQUE IOHEXOL 350 MG/ML SOLN COMPARISON:  CT abdomen/pelvis 06/27/2021 FINDINGS: Lower chest: A necrotic  mass is again seen in the lingula measuring up to 6.0 cm AP by 3.3 cm TV, incompletely imaged on the current study but grossly similar to the prior study to the level imaged. The imaged heart is unremarkable. Hepatobiliary: The liver and gallbladder are unremarkable. Pancreas: Unremarkable. Spleen: Unremarkable. Adrenals/Urinary Tract: The adrenals are unremarkable. The kidneys are unremarkable, with no focal lesion, stone, hydronephrosis, or hydroureter. The bladder is unremarkable. Stomach/Bowel: Surgical clips around the stomach are unchanged. The stomach itself is unremarkable. There is no evidence of bowel obstruction. There is no abnormal bowel wall thickening or inflammatory change. The suspected appendix is normal (3-61). Vascular/Lymphatic: There is calcified atherosclerotic plaque throughout the nonaneurysmal abdominal aorta. The major branch vessels are patent. The main portal and splenic veins are patent. There is no abdominal or pelvic lymphadenopathy. Reproductive: The prostate and seminal vesicles are unremarkable. Other: There is no ascites or free air. Musculoskeletal: Hardware in the right femur is partially imaged. There is a 1.1 cm lytic lesion in the right iliac bone, unchanged. No other focal osseous lesions are seen. There is no acute osseous abnormality. IMPRESSION: 1. No acute findings in the abdomen or pelvis. 2. Necrotic mass in the lingula again concerning for malignancy, not significantly changed to the level imaged. 3. Lytic lesion in the right iliac bone is indeterminate but suspicious for osseous metastasis. Otherwise, no evidence of  metastatic disease in the abdomen or pelvis. 4.  Aortic Atherosclerosis (ICD10-I70.0). Electronically Signed   By: Valetta Mole M.D.   On: 07/30/2021 16:07   NM PET Image Initial (PI) Skull Base To Thigh  Result Date: 07/16/2021 CLINICAL DATA:  Initial treatment strategy for non-small cell lung cancer. EXAM: NUCLEAR MEDICINE PET SKULL BASE TO THIGH TECHNIQUE: 6.2 mCi F-18 FDG was injected intravenously. Full-ring PET imaging was performed from the skull base to thigh after the radiotracer. CT data was obtained and used for attenuation correction and anatomic localization. Fasting blood glucose: 168 mg/dl COMPARISON:  CT chest abdomen pelvis dated May 28, 2021 FINDINGS: Mediastinal blood pool activity: SUV max 1.0 Liver activity: SUV max 1.7 NECK: No hypermetabolic lymph nodes in the neck. Incidental CT findings: Cardiomegaly. Coronary artery calcifications with RCA stent pleural centrilobular emphysema. Atherosclerotic disease of the thoracic aorta. CHEST: Enlarged hypermetabolic paratracheal and prevascular mediastinal lymph nodes. Reference precarinal mediastinal lymph node measures 2.1 cm in short axis on image 70 with an SUV max of 7.9. Dominant hypermetabolic left upper lobe mass measures 5.4 x 3.8 cm with an SUV max of 6.8. Reference left upper lobe spiculated nodule measuring 9 x 7 mm on image 58 with no increased FDG activity. Reference right upper lobe spiculated nodule measuring 6 mm on image 49 with SUV max of 0.9. Incidental CT findings: none ABDOMEN/PELVIS: No abnormal hypermetabolic activity within the liver, pancreas, adrenal glands, or spleen. No hypermetabolic lymph nodes in the abdomen or pelvis. Incidental CT findings: Aortoiliac atherosclerotic disease. SKELETON: No focal hypermetabolic activity to suggest skeletal metastasis. Incidental CT findings: Lucent lesion of the right pelvis seen on image 156 with no FDG uptake. IMPRESSION: Dominant left upper lobe mass demonstrates marked  hypermetabolic activity. Additional bilateral spiculated nodules demonstrate FDG activity similar to background, possibly related to small size. Findings remain concerning for metastatic disease or additional primaries. Recommend attention on follow-up. Enlarged and hypermetabolic paratracheal and prevascular mediastinal lymph nodes, concerning for metastatic disease. No evidence of metastatic disease in the abdomen or pelvis. Aortic Atherosclerosis (ICD10-I70.0) and Emphysema (ICD10-J43.9). Electronically Signed   By:  Yetta Glassman M.D.   On: 07/16/2021 17:15   DG CHEST PORT 1 VIEW  Result Date: 07/02/2021 CLINICAL DATA:  Status post bronchoscopy with biopsy EXAM: PORTABLE CHEST 1 VIEW COMPARISON:  Radiograph 06/27/2021, chest CT 06/27/2021 FINDINGS: Unchanged cardiomediastinal silhouette. Slight increased density of the lingular masslike consolidation. There is new consolidation in the left apex. Adjacent to a known pulmonary nodule. No large pleural effusion or visible pneumothorax. No acute osseous abnormality. IMPRESSION: Increased density of the lingular masslike consolidation and new consolidation left apex adjacent to a known pulmonary nodule. Findings could reflect post biopsy changes/hemorrhage from recent bronchoscopy. Recommend trending with radiographic follow-up. No visible pneumothorax. Electronically Signed   By: Maurine Simmering   On: 07/02/2021 13:54   DG C-ARM BRONCHOSCOPY  Result Date: 07/02/2021 C-ARM BRONCHOSCOPY: Fluoroscopy was utilized by the requesting physician.  No radiographic interpretation.      Garald Balding, PA-C 07/30/21 [place], DO 07/30/21 [phone]

## 2021-07-30 NOTE — ED Provider Notes (Signed)
Emergency Medicine Provider Triage Evaluation Note  Frank Hart , a 75 y.o. male  was evaluated in triage.  Pt complains of weakness and abnormal lab. Seen by PCP earlier. Noted CBG >600. Feels generally fatigue. Some lower abd cramping. Noted some blood in his stool, bright red. No urinary changes. Current getting chemo for CA however does not know what type. No CP, SOB. Taking DM meds at home.  Review of Systems  Positive: Weakness, blood in stool, hyperglycemia Negative: CP, SOB, cough  Physical Exam  BP 109/69   Pulse (!) 106   Temp 97.7 F (36.5 C) (Oral)   Resp 14   SpO2 100%  Gen:   Awake, no distress   Resp:  Normal effort  MSK:   Moves extremities without difficulty  ABD:  Soft non tender Other:    Medical Decision Making  Medically screening exam initiated at 11:11 AM.  Appropriate orders placed.  Frank Hart was informed that the remainder of the evaluation will be completed by another provider, this initial triage assessment does not replace that evaluation, and the importance of remaining in the ED until their evaluation is complete.  Hyperglycemia, blood in stool   Christos Mixson A, PA-C 07/30/21 1113    Carmin Muskrat, MD 07/30/21 1636
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
## 2021-07-30 NOTE — ED Triage Notes (Signed)
Pt reports being sent by pcp due to hyperglycemia. Reports taking meds as precribed. Cbg > 500 at triage. Reports not feeling well and no appetite, is currently under treatment for lung cancer but now also having abd pain, reports possible blood in stools recently.

## 2021-07-30 NOTE — Discharge Instructions (Addendum)
Your blood sugar was likely elevated due to the Ensures that you have been consuming. Please try to avoid high sugar foods, and follow up with your oncologist about dietary options. Return to the ER for any new or worsening symptoms

## 2021-07-31 ENCOUNTER — Ambulatory Visit
Admission: RE | Admit: 2021-07-31 | Discharge: 2021-07-31 | Disposition: A | Discharge status: Home / Self Care | Payer: Medicare PPO | Source: Ambulatory Visit | Attending: Radiation Oncology | Admitting: Radiation Oncology

## 2021-07-31 DIAGNOSIS — C3412 Malignant neoplasm of upper lobe, left bronchus or lung: Secondary | ICD-10-CM | POA: Diagnosis not present

## 2021-07-31 DIAGNOSIS — E1165 Type 2 diabetes mellitus with hyperglycemia: Secondary | ICD-10-CM | POA: Diagnosis not present

## 2021-07-31 DIAGNOSIS — Z510 Encounter for antineoplastic radiation therapy: Secondary | ICD-10-CM | POA: Diagnosis not present

## 2021-07-31 DIAGNOSIS — Z7984 Long term (current) use of oral hypoglycemic drugs: Secondary | ICD-10-CM | POA: Diagnosis not present

## 2021-07-31 MED FILL — Dexamethasone Sodium Phosphate Inj 100 MG/10ML: INTRAMUSCULAR | Qty: 1 | Status: AC

## 2021-08-02 ENCOUNTER — Other Ambulatory Visit: Payer: Self-pay | Admitting: Internal Medicine

## 2021-08-03 ENCOUNTER — Encounter: Payer: Self-pay | Admitting: Internal Medicine

## 2021-08-04 ENCOUNTER — Other Ambulatory Visit: Payer: Self-pay

## 2021-08-04 ENCOUNTER — Ambulatory Visit
Admission: RE | Admit: 2021-08-04 | Discharge: 2021-08-04 | Disposition: A | Discharge status: Home / Self Care | Payer: Medicare PPO | Source: Ambulatory Visit | Attending: Radiation Oncology | Admitting: Radiation Oncology

## 2021-08-04 ENCOUNTER — Inpatient Hospital Stay: Payer: Medicare PPO

## 2021-08-04 VITALS — BP 99/81 | HR 107 | Temp 98.9°F | Resp 20 | Ht 71.0 in | Wt 116.5 lb

## 2021-08-04 DIAGNOSIS — I252 Old myocardial infarction: Secondary | ICD-10-CM | POA: Diagnosis not present

## 2021-08-04 DIAGNOSIS — Z7984 Long term (current) use of oral hypoglycemic drugs: Secondary | ICD-10-CM | POA: Diagnosis not present

## 2021-08-04 DIAGNOSIS — Z5111 Encounter for antineoplastic chemotherapy: Secondary | ICD-10-CM | POA: Diagnosis not present

## 2021-08-04 DIAGNOSIS — Z510 Encounter for antineoplastic radiation therapy: Secondary | ICD-10-CM | POA: Diagnosis not present

## 2021-08-04 DIAGNOSIS — I10 Essential (primary) hypertension: Secondary | ICD-10-CM | POA: Diagnosis not present

## 2021-08-04 DIAGNOSIS — C3492 Malignant neoplasm of unspecified part of left bronchus or lung: Secondary | ICD-10-CM

## 2021-08-04 DIAGNOSIS — E1165 Type 2 diabetes mellitus with hyperglycemia: Secondary | ICD-10-CM | POA: Diagnosis not present

## 2021-08-04 DIAGNOSIS — Z79899 Other long term (current) drug therapy: Secondary | ICD-10-CM | POA: Diagnosis not present

## 2021-08-04 DIAGNOSIS — C3412 Malignant neoplasm of upper lobe, left bronchus or lung: Secondary | ICD-10-CM | POA: Diagnosis not present

## 2021-08-04 DIAGNOSIS — Z923 Personal history of irradiation: Secondary | ICD-10-CM | POA: Diagnosis not present

## 2021-08-04 LAB — CMP (CANCER CENTER ONLY)
ALT: 22 U/L (ref 0–44)
AST: 9 U/L — ABNORMAL LOW (ref 15–41)
Albumin: 2.9 g/dL — ABNORMAL LOW (ref 3.5–5.0)
Alkaline Phosphatase: 231 U/L — ABNORMAL HIGH (ref 38–126)
Anion gap: 12 (ref 5–15)
BUN: 14 mg/dL (ref 8–23)
CO2: 22 mmol/L (ref 22–32)
Calcium: 9.3 mg/dL (ref 8.9–10.3)
Chloride: 106 mmol/L (ref 98–111)
Creatinine: 1.03 mg/dL (ref 0.61–1.24)
GFR, Estimated: >60 mL/min (ref >60)
Glucose, Bld: 247 mg/dL — ABNORMAL HIGH (ref 70–99)
Potassium: 4 mmol/L (ref 3.5–5.1)
Sodium: 140 mmol/L (ref 135–145)
Total Bilirubin: 0.9 mg/dL (ref 0.3–1.2)
Total Protein: 8.2 g/dL — ABNORMAL HIGH (ref 6.5–8.1)

## 2021-08-04 LAB — CBC WITH DIFFERENTIAL (CANCER CENTER ONLY)
Abs Immature Granulocytes: 1.53 10*3/uL — ABNORMAL HIGH (ref 0.00–0.07)
Basophils Absolute: 0.2 10*3/uL — ABNORMAL HIGH (ref 0.0–0.1)
Basophils Relative: 0 %
Eosinophils Absolute: 0 10*3/uL (ref 0.0–0.5)
Eosinophils Relative: 0 %
HCT: 31.7 % — ABNORMAL LOW (ref 39.0–52.0)
Hemoglobin: 10.4 g/dL — ABNORMAL LOW (ref 13.0–17.0)
Immature Granulocytes: 3 %
Lymphocytes Relative: 3 %
Lymphs Abs: 1.6 10*3/uL (ref 0.7–4.0)
MCH: 26.9 pg (ref 26.0–34.0)
MCHC: 32.8 g/dL (ref 30.0–36.0)
MCV: 81.9 fL (ref 80.0–100.0)
Monocytes Absolute: 1.9 10*3/uL — ABNORMAL HIGH (ref 0.1–1.0)
Monocytes Relative: 4 %
Neutro Abs: 46.4 10*3/uL — ABNORMAL HIGH (ref 1.7–7.7)
Neutrophils Relative %: 90 %
Platelet Count: 430 10*3/uL — ABNORMAL HIGH (ref 150–400)
RBC: 3.87 MIL/uL — ABNORMAL LOW (ref 4.22–5.81)
RDW: 13.8 % (ref 11.5–15.5)
WBC Count: 51.6 10*3/uL (ref 4.0–10.5)
nRBC: 0 % (ref 0.0–0.2)

## 2021-08-04 MED ORDER — PALONOSETRON HCL INJECTION 0.25 MG/5ML
0.2500 mg | Freq: Once | INTRAVENOUS | Status: AC
  Administered 2021-08-04: 0.25 mg via INTRAVENOUS
  Filled 2021-08-04: qty 5

## 2021-08-04 MED ORDER — SODIUM CHLORIDE 0.9 % IV SOLN
45.0000 mg/m2 | Freq: Once | INTRAVENOUS | Status: AC
  Administered 2021-08-04: 78 mg via INTRAVENOUS
  Filled 2021-08-04: qty 13

## 2021-08-04 MED ORDER — FAMOTIDINE 20 MG IN NS 100 ML IVPB
20.0000 mg | Freq: Once | INTRAVENOUS | Status: AC
  Administered 2021-08-04: 20 mg via INTRAVENOUS
  Filled 2021-08-04: qty 100

## 2021-08-04 MED ORDER — SODIUM CHLORIDE 0.9 % IV SOLN: Freq: Once | INTRAVENOUS | Status: AC

## 2021-08-04 MED ORDER — DIPHENHYDRAMINE HCL 50 MG/ML IJ SOLN
50.0000 mg | Freq: Once | INTRAMUSCULAR | Status: AC
  Administered 2021-08-04: 50 mg via INTRAVENOUS
  Filled 2021-08-04: qty 1

## 2021-08-04 MED ORDER — SODIUM CHLORIDE 0.9 % IV SOLN
150.0000 mg | Freq: Once | INTRAVENOUS | Status: AC
  Administered 2021-08-04: 150 mg via INTRAVENOUS
  Filled 2021-08-04: qty 15

## 2021-08-04 MED ORDER — SODIUM CHLORIDE 0.9 % IV SOLN
10.0000 mg | Freq: Once | INTRAVENOUS | Status: AC
  Administered 2021-08-04: 10 mg via INTRAVENOUS
  Filled 2021-08-04: qty 10

## 2021-08-04 NOTE — Patient Instructions (Signed)
[facility]  Discharge Instructions: Thank you for choosing [facility] to provide your oncology and hematology care.   If you have a lab appointment with the Cancer Center, please go directly to the Cancer Center and check in at the registration area.   Wear comfortable clothing and clothing appropriate for easy access to any Portacath or PICC line.   We strive to give you quality time with your provider. You may need to reschedule your appointment if you arrive late (15 or more minutes).  Arriving late affects you and other patients whose appointments are after yours.  Also, if you miss three or more appointments without notifying the office, you may be dismissed from the clinic at the provider's discretion.      For prescription refill requests, have your pharmacy contact our office and allow 72 hours for refills to be completed.    Today you received the following chemotherapy and/or immunotherapy agents: Paclitaxel (Taxol) and Carboplatin.   To help prevent nausea and vomiting after your treatment, we encourage you to take your nausea medication as directed.  BELOW ARE SYMPTOMS THAT SHOULD BE REPORTED IMMEDIATELY: *FEVER GREATER THAN 100.4 F (38 C) OR HIGHER *CHILLS OR SWEATING *NAUSEA AND VOMITING THAT IS NOT CONTROLLED WITH YOUR NAUSEA MEDICATION *UNUSUAL SHORTNESS OF BREATH *UNUSUAL BRUISING OR BLEEDING *URINARY PROBLEMS (pain or burning when urinating, or frequent urination) *BOWEL PROBLEMS (unusual diarrhea, constipation, pain near the anus) TENDERNESS IN MOUTH AND THROAT WITH OR WITHOUT PRESENCE OF ULCERS (sore throat, sores in mouth, or a toothache) UNUSUAL RASH, SWELLING OR PAIN  UNUSUAL VAGINAL DISCHARGE OR ITCHING   Items with * indicate a potential emergency and should be followed up as soon as possible or go to the Emergency Department if any problems should occur.  Please show the CHEMOTHERAPY ALERT CARD or IMMUNOTHERAPY  ALERT CARD at check-in to the Emergency Department and triage nurse.  Should you have questions after your visit or need to cancel or reschedule your appointment, please contact [facility]  Dept: [phone]  and follow the prompts.  Office hours are 8:00 a.m. to 4:30 p.m. Monday - Friday. Please note that voicemails left after 4:00 p.m. may not be returned until the following business day.  We are closed weekends and major holidays. You have access to a nurse at all times for urgent questions. Please call the main number to the clinic Dept: [phone] and follow the prompts.   For any non-urgent questions, you may also contact your provider using MyChart. We now offer e-Visits for anyone 18 and older to request care online for non-urgent symptoms. For details visit [email or web address].   Also download the MyChart app! Go to the app store, search "MyChart", open the app, select [facility], and log in with your MyChart username and password.  Due to Covid, a mask is required upon entering the hospital/clinic. If you do not have a mask, one will be given to you upon arrival. For doctor visits, patients may have 1 support person aged 18 or older with them. For treatment visits, patients cannot have anyone with them due to current Covid guidelines and our immunocompromised population.   

## 2021-08-04 NOTE — Progress Notes (Signed)
Per Dr. Julien Nordmann, ok for treatment today with elevated WBC and heart rate 107,BP 99/81. Pt. Denies complaints of chest pain, dizziness, and no shortness of breath noted.

## 2021-08-05 ENCOUNTER — Ambulatory Visit
Admission: RE | Admit: 2021-08-05 | Discharge: 2021-08-05 | Disposition: A | Discharge status: Home / Self Care | Payer: Medicare PPO | Source: Ambulatory Visit | Attending: Radiation Oncology | Admitting: Radiation Oncology

## 2021-08-05 DIAGNOSIS — C3412 Malignant neoplasm of upper lobe, left bronchus or lung: Secondary | ICD-10-CM | POA: Diagnosis not present

## 2021-08-05 DIAGNOSIS — E1165 Type 2 diabetes mellitus with hyperglycemia: Secondary | ICD-10-CM | POA: Diagnosis not present

## 2021-08-05 DIAGNOSIS — Z7984 Long term (current) use of oral hypoglycemic drugs: Secondary | ICD-10-CM | POA: Diagnosis not present

## 2021-08-05 DIAGNOSIS — Z510 Encounter for antineoplastic radiation therapy: Secondary | ICD-10-CM | POA: Diagnosis not present

## 2021-08-05 LAB — BCR ABL1 FISH (GENPATH)
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
## 2021-08-06 ENCOUNTER — Ambulatory Visit
Admission: RE | Admit: 2021-08-06 | Discharge: 2021-08-06 | Disposition: A | Discharge status: Home / Self Care | Payer: Medicare PPO | Source: Ambulatory Visit | Attending: Radiation Oncology | Admitting: Radiation Oncology

## 2021-08-06 ENCOUNTER — Other Ambulatory Visit: Payer: Self-pay

## 2021-08-06 DIAGNOSIS — Z7984 Long term (current) use of oral hypoglycemic drugs: Secondary | ICD-10-CM | POA: Diagnosis not present

## 2021-08-06 DIAGNOSIS — E1165 Type 2 diabetes mellitus with hyperglycemia: Secondary | ICD-10-CM | POA: Diagnosis not present

## 2021-08-06 DIAGNOSIS — Z510 Encounter for antineoplastic radiation therapy: Secondary | ICD-10-CM | POA: Diagnosis not present

## 2021-08-06 DIAGNOSIS — C3412 Malignant neoplasm of upper lobe, left bronchus or lung: Secondary | ICD-10-CM | POA: Diagnosis not present

## 2021-08-07 ENCOUNTER — Ambulatory Visit
Admission: RE | Admit: 2021-08-07 | Discharge: 2021-08-07 | Disposition: A | Discharge status: Home / Self Care | Payer: Medicare PPO | Source: Ambulatory Visit | Attending: Radiation Oncology | Admitting: Radiation Oncology

## 2021-08-07 DIAGNOSIS — E1165 Type 2 diabetes mellitus with hyperglycemia: Secondary | ICD-10-CM | POA: Diagnosis not present

## 2021-08-07 DIAGNOSIS — C3412 Malignant neoplasm of upper lobe, left bronchus or lung: Secondary | ICD-10-CM | POA: Diagnosis not present

## 2021-08-07 DIAGNOSIS — Z7984 Long term (current) use of oral hypoglycemic drugs: Secondary | ICD-10-CM | POA: Diagnosis not present

## 2021-08-07 DIAGNOSIS — Z510 Encounter for antineoplastic radiation therapy: Secondary | ICD-10-CM | POA: Diagnosis not present

## 2021-08-10 ENCOUNTER — Inpatient Hospital Stay: Payer: Medicare PPO

## 2021-08-10 ENCOUNTER — Ambulatory Visit
Admission: RE | Admit: 2021-08-10 | Discharge: 2021-08-10 | Disposition: A | Discharge status: Home / Self Care | Payer: Medicare PPO | Source: Ambulatory Visit | Attending: Radiation Oncology | Admitting: Radiation Oncology

## 2021-08-10 ENCOUNTER — Encounter: Payer: Self-pay | Admitting: Physician Assistant

## 2021-08-10 ENCOUNTER — Other Ambulatory Visit: Payer: Self-pay

## 2021-08-10 ENCOUNTER — Inpatient Hospital Stay (HOSPITAL_BASED_OUTPATIENT_CLINIC_OR_DEPARTMENT_OTHER): Payer: Medicare PPO | Admitting: Physician Assistant

## 2021-08-10 ENCOUNTER — Encounter: Payer: Self-pay | Admitting: Internal Medicine

## 2021-08-10 VITALS — BP 93/52 | HR 87 | Temp 97.7°F | Resp 18 | Wt 117.1 lb

## 2021-08-10 DIAGNOSIS — I959 Hypotension, unspecified: Secondary | ICD-10-CM

## 2021-08-10 DIAGNOSIS — C3492 Malignant neoplasm of unspecified part of left bronchus or lung: Secondary | ICD-10-CM

## 2021-08-10 DIAGNOSIS — R1310 Dysphagia, unspecified: Secondary | ICD-10-CM

## 2021-08-10 DIAGNOSIS — I10 Essential (primary) hypertension: Secondary | ICD-10-CM | POA: Diagnosis not present

## 2021-08-10 DIAGNOSIS — E1165 Type 2 diabetes mellitus with hyperglycemia: Secondary | ICD-10-CM | POA: Diagnosis not present

## 2021-08-10 DIAGNOSIS — Z923 Personal history of irradiation: Secondary | ICD-10-CM | POA: Diagnosis not present

## 2021-08-10 DIAGNOSIS — I252 Old myocardial infarction: Secondary | ICD-10-CM | POA: Diagnosis not present

## 2021-08-10 DIAGNOSIS — Z5111 Encounter for antineoplastic chemotherapy: Secondary | ICD-10-CM

## 2021-08-10 DIAGNOSIS — Z79899 Other long term (current) drug therapy: Secondary | ICD-10-CM | POA: Diagnosis not present

## 2021-08-10 DIAGNOSIS — C3412 Malignant neoplasm of upper lobe, left bronchus or lung: Secondary | ICD-10-CM | POA: Diagnosis not present

## 2021-08-10 DIAGNOSIS — Z7984 Long term (current) use of oral hypoglycemic drugs: Secondary | ICD-10-CM | POA: Diagnosis not present

## 2021-08-10 DIAGNOSIS — Z510 Encounter for antineoplastic radiation therapy: Secondary | ICD-10-CM | POA: Diagnosis not present

## 2021-08-10 LAB — CBC WITH DIFFERENTIAL (CANCER CENTER ONLY)
Abs Immature Granulocytes: 1.44 10*3/uL — ABNORMAL HIGH (ref 0.00–0.07)
Basophils Absolute: 0.2 10*3/uL — ABNORMAL HIGH (ref 0.0–0.1)
Basophils Relative: 0 %
Eosinophils Absolute: 0.1 10*3/uL (ref 0.0–0.5)
Eosinophils Relative: 0 %
HCT: 30.1 % — ABNORMAL LOW (ref 39.0–52.0)
Hemoglobin: 9.5 g/dL — ABNORMAL LOW (ref 13.0–17.0)
Immature Granulocytes: 3 %
Lymphocytes Relative: 2 %
Lymphs Abs: 0.8 10*3/uL (ref 0.7–4.0)
MCH: 26.7 pg (ref 26.0–34.0)
MCHC: 31.6 g/dL (ref 30.0–36.0)
MCV: 84.6 fL (ref 80.0–100.0)
Monocytes Absolute: 1.3 10*3/uL — ABNORMAL HIGH (ref 0.1–1.0)
Monocytes Relative: 3 %
Neutro Abs: 42.1 10*3/uL — ABNORMAL HIGH (ref 1.7–7.7)
Neutrophils Relative %: 92 %
Platelet Count: 324 10*3/uL (ref 150–400)
RBC: 3.56 MIL/uL — ABNORMAL LOW (ref 4.22–5.81)
RDW: 14.6 % (ref 11.5–15.5)
WBC Count: 45.8 10*3/uL — ABNORMAL HIGH (ref 4.0–10.5)
nRBC: 0 % (ref 0.0–0.2)

## 2021-08-10 LAB — CMP (CANCER CENTER ONLY)
ALT: 19 U/L (ref 0–44)
AST: 16 U/L (ref 15–41)
Albumin: 2.7 g/dL — ABNORMAL LOW (ref 3.5–5.0)
Alkaline Phosphatase: 245 U/L — ABNORMAL HIGH (ref 38–126)
Anion gap: 11 (ref 5–15)
BUN: 15 mg/dL (ref 8–23)
CO2: 24 mmol/L (ref 22–32)
Calcium: 9.1 mg/dL (ref 8.9–10.3)
Chloride: 102 mmol/L (ref 98–111)
Creatinine: 1.05 mg/dL (ref 0.61–1.24)
GFR, Estimated: >60 mL/min (ref >60)
Glucose, Bld: 353 mg/dL — ABNORMAL HIGH (ref 70–99)
Potassium: 4 mmol/L (ref 3.5–5.1)
Sodium: 137 mmol/L (ref 135–145)
Total Bilirubin: 0.4 mg/dL (ref 0.3–1.2)
Total Protein: 7.5 g/dL (ref 6.5–8.1)

## 2021-08-10 MED ORDER — SODIUM CHLORIDE 0.9 % IV SOLN: Freq: Once | INTRAVENOUS | Status: AC

## 2021-08-10 MED ORDER — FAMOTIDINE 20 MG IN NS 100 ML IVPB
20.0000 mg | Freq: Once | INTRAVENOUS | Status: AC
  Administered 2021-08-10: 20 mg via INTRAVENOUS
  Filled 2021-08-10: qty 100

## 2021-08-10 MED ORDER — DIPHENHYDRAMINE HCL 50 MG/ML IJ SOLN
50.0000 mg | Freq: Once | INTRAMUSCULAR | Status: AC
  Administered 2021-08-10: 50 mg via INTRAVENOUS
  Filled 2021-08-10: qty 1

## 2021-08-10 MED ORDER — SODIUM CHLORIDE 0.9 % IV SOLN
10.0000 mg | Freq: Once | INTRAVENOUS | Status: AC
  Administered 2021-08-10: 10 mg via INTRAVENOUS
  Filled 2021-08-10: qty 10

## 2021-08-10 MED ORDER — SODIUM CHLORIDE 0.9 % IV SOLN
45.0000 mg/m2 | Freq: Once | INTRAVENOUS | Status: AC
  Administered 2021-08-10: 78 mg via INTRAVENOUS
  Filled 2021-08-10: qty 13

## 2021-08-10 MED ORDER — INSULIN ASPART 100 UNIT/ML IJ SOLN
10.0000 [IU] | Freq: Once | INTRAMUSCULAR | Status: AC
  Administered 2021-08-10: 10 [IU] via SUBCUTANEOUS
  Filled 2021-08-10: qty 1

## 2021-08-10 MED ORDER — PALONOSETRON HCL INJECTION 0.25 MG/5ML
0.2500 mg | Freq: Once | INTRAVENOUS | Status: AC
  Administered 2021-08-10: 0.25 mg via INTRAVENOUS
  Filled 2021-08-10: qty 5

## 2021-08-10 MED ORDER — SUCRALFATE 1 G PO TABS: 1.0000 g | ORAL_TABLET | Freq: Three times a day (TID) | ORAL | 0 refills | Status: DC

## 2021-08-10 MED ORDER — SODIUM CHLORIDE 0.9 % IV SOLN
145.8000 mg | Freq: Once | INTRAVENOUS | Status: AC
  Administered 2021-08-10: 150 mg via INTRAVENOUS
  Filled 2021-08-10: qty 15

## 2021-08-10 NOTE — Patient Instructions (Signed)
[facility]   Discharge Instructions: Thank you for choosing [facility] to provide your oncology and hematology care.   If you have a lab appointment with the Cancer Center, please go directly to the Cancer Center and check in at the registration area.   Wear comfortable clothing and clothing appropriate for easy access to any Portacath or PICC line.   We strive to give you quality time with your provider. You may need to reschedule your appointment if you arrive late (15 or more minutes).  Arriving late affects you and other patients whose appointments are after yours.  Also, if you miss three or more appointments without notifying the office, you may be dismissed from the clinic at the provider's discretion.      For prescription refill requests, have your pharmacy contact our office and allow 72 hours for refills to be completed.    Today you received the following chemotherapy and/or immunotherapy agents: paclitaxel and carboplatin.      To help prevent nausea and vomiting after your treatment, we encourage you to take your nausea medication as directed.  BELOW ARE SYMPTOMS THAT SHOULD BE REPORTED IMMEDIATELY: *FEVER GREATER THAN 100.4 F (38 C) OR HIGHER *CHILLS OR SWEATING *NAUSEA AND VOMITING THAT IS NOT CONTROLLED WITH YOUR NAUSEA MEDICATION *UNUSUAL SHORTNESS OF BREATH *UNUSUAL BRUISING OR BLEEDING *URINARY PROBLEMS (pain or burning when urinating, or frequent urination) *BOWEL PROBLEMS (unusual diarrhea, constipation, pain near the anus) TENDERNESS IN MOUTH AND THROAT WITH OR WITHOUT PRESENCE OF ULCERS (sore throat, sores in mouth, or a toothache) UNUSUAL RASH, SWELLING OR PAIN  UNUSUAL VAGINAL DISCHARGE OR ITCHING   Items with * indicate a potential emergency and should be followed up as soon as possible or go to the Emergency Department if any problems should occur.  Please show the CHEMOTHERAPY ALERT CARD or IMMUNOTHERAPY ALERT  CARD at check-in to the Emergency Department and triage nurse.  Should you have questions after your visit or need to cancel or reschedule your appointment, please contact [facility]  Dept: [phone]  and follow the prompts.  Office hours are 8:00 a.m. to 4:30 p.m. Monday - Friday. Please note that voicemails left after 4:00 p.m. may not be returned until the following business day.  We are closed weekends and major holidays. You have access to a nurse at all times for urgent questions. Please call the main number to the clinic Dept: [phone] and follow the prompts.   For any non-urgent questions, you may also contact your provider using MyChart. We now offer e-Visits for anyone 18 and older to request care online for non-urgent symptoms. For details visit [email or web address].   Also download the MyChart app! Go to the app store, search "MyChart", open the app, select [facility], and log in with your MyChart username and password.  Due to Covid, a mask is required upon entering the hospital/clinic. If you do not have a mask, one will be given to you upon arrival. For doctor visits, patients may have 1 support person aged 18 or older with them. For treatment visits, patients cannot have anyone with them due to current Covid guidelines and our immunocompromised population.   

## 2021-08-11 ENCOUNTER — Ambulatory Visit
Admission: RE | Admit: 2021-08-11 | Discharge: 2021-08-11 | Disposition: A | Discharge status: Home / Self Care | Payer: Medicare PPO | Source: Ambulatory Visit | Attending: Radiation Oncology | Admitting: Radiation Oncology

## 2021-08-11 DIAGNOSIS — E1165 Type 2 diabetes mellitus with hyperglycemia: Secondary | ICD-10-CM | POA: Diagnosis not present

## 2021-08-11 DIAGNOSIS — C3412 Malignant neoplasm of upper lobe, left bronchus or lung: Secondary | ICD-10-CM | POA: Diagnosis not present

## 2021-08-11 DIAGNOSIS — Z510 Encounter for antineoplastic radiation therapy: Secondary | ICD-10-CM | POA: Diagnosis not present

## 2021-08-11 DIAGNOSIS — Z7984 Long term (current) use of oral hypoglycemic drugs: Secondary | ICD-10-CM | POA: Diagnosis not present

## 2021-08-11 LAB — GLUCOSE, CAPILLARY: Glucose-Capillary: 175 mg/dL — ABNORMAL HIGH (ref 70–99)

## 2021-08-12 ENCOUNTER — Other Ambulatory Visit: Payer: Self-pay

## 2021-08-12 ENCOUNTER — Ambulatory Visit
Admission: RE | Admit: 2021-08-12 | Discharge: 2021-08-12 | Disposition: A | Discharge status: Home / Self Care | Payer: Medicare PPO | Source: Ambulatory Visit | Attending: Radiation Oncology | Admitting: Radiation Oncology

## 2021-08-12 DIAGNOSIS — Z7984 Long term (current) use of oral hypoglycemic drugs: Secondary | ICD-10-CM | POA: Diagnosis not present

## 2021-08-12 DIAGNOSIS — C3412 Malignant neoplasm of upper lobe, left bronchus or lung: Secondary | ICD-10-CM | POA: Diagnosis not present

## 2021-08-12 DIAGNOSIS — E1165 Type 2 diabetes mellitus with hyperglycemia: Secondary | ICD-10-CM | POA: Diagnosis not present

## 2021-08-12 DIAGNOSIS — Z510 Encounter for antineoplastic radiation therapy: Secondary | ICD-10-CM | POA: Diagnosis not present

## 2021-08-13 ENCOUNTER — Other Ambulatory Visit: Payer: Self-pay | Admitting: Physician Assistant

## 2021-08-13 ENCOUNTER — Ambulatory Visit
Admission: RE | Admit: 2021-08-13 | Discharge: 2021-08-13 | Disposition: A | Discharge status: Home / Self Care | Payer: Medicare PPO | Source: Ambulatory Visit | Attending: Radiation Oncology | Admitting: Radiation Oncology

## 2021-08-13 DIAGNOSIS — R1310 Dysphagia, unspecified: Secondary | ICD-10-CM

## 2021-08-13 DIAGNOSIS — C3412 Malignant neoplasm of upper lobe, left bronchus or lung: Secondary | ICD-10-CM | POA: Diagnosis not present

## 2021-08-13 DIAGNOSIS — Z7984 Long term (current) use of oral hypoglycemic drugs: Secondary | ICD-10-CM | POA: Diagnosis not present

## 2021-08-13 DIAGNOSIS — E1165 Type 2 diabetes mellitus with hyperglycemia: Secondary | ICD-10-CM | POA: Diagnosis not present

## 2021-08-13 DIAGNOSIS — Z510 Encounter for antineoplastic radiation therapy: Secondary | ICD-10-CM | POA: Diagnosis not present

## 2021-08-14 ENCOUNTER — Other Ambulatory Visit: Payer: Self-pay

## 2021-08-14 ENCOUNTER — Ambulatory Visit
Admission: RE | Admit: 2021-08-14 | Discharge: 2021-08-14 | Disposition: A | Discharge status: Home / Self Care | Payer: Medicare PPO | Source: Ambulatory Visit | Attending: Radiation Oncology | Admitting: Radiation Oncology

## 2021-08-14 DIAGNOSIS — E1165 Type 2 diabetes mellitus with hyperglycemia: Secondary | ICD-10-CM | POA: Diagnosis not present

## 2021-08-14 DIAGNOSIS — C3412 Malignant neoplasm of upper lobe, left bronchus or lung: Secondary | ICD-10-CM | POA: Diagnosis not present

## 2021-08-14 DIAGNOSIS — Z510 Encounter for antineoplastic radiation therapy: Secondary | ICD-10-CM | POA: Diagnosis not present

## 2021-08-14 DIAGNOSIS — Z7984 Long term (current) use of oral hypoglycemic drugs: Secondary | ICD-10-CM | POA: Diagnosis not present

## 2021-08-14 IMAGING — CR DG CHEST 2V
2 series · 2 of 2 positions shown · non-contrast
Comparison: Single-view of the chest 06/22/2021 and 10/11/2015.

CLINICAL DATA: Hemoptysis.  Chronic cough.  Cigarette smoker.

EXAM:
CHEST - 2 VIEW

[chest pa]
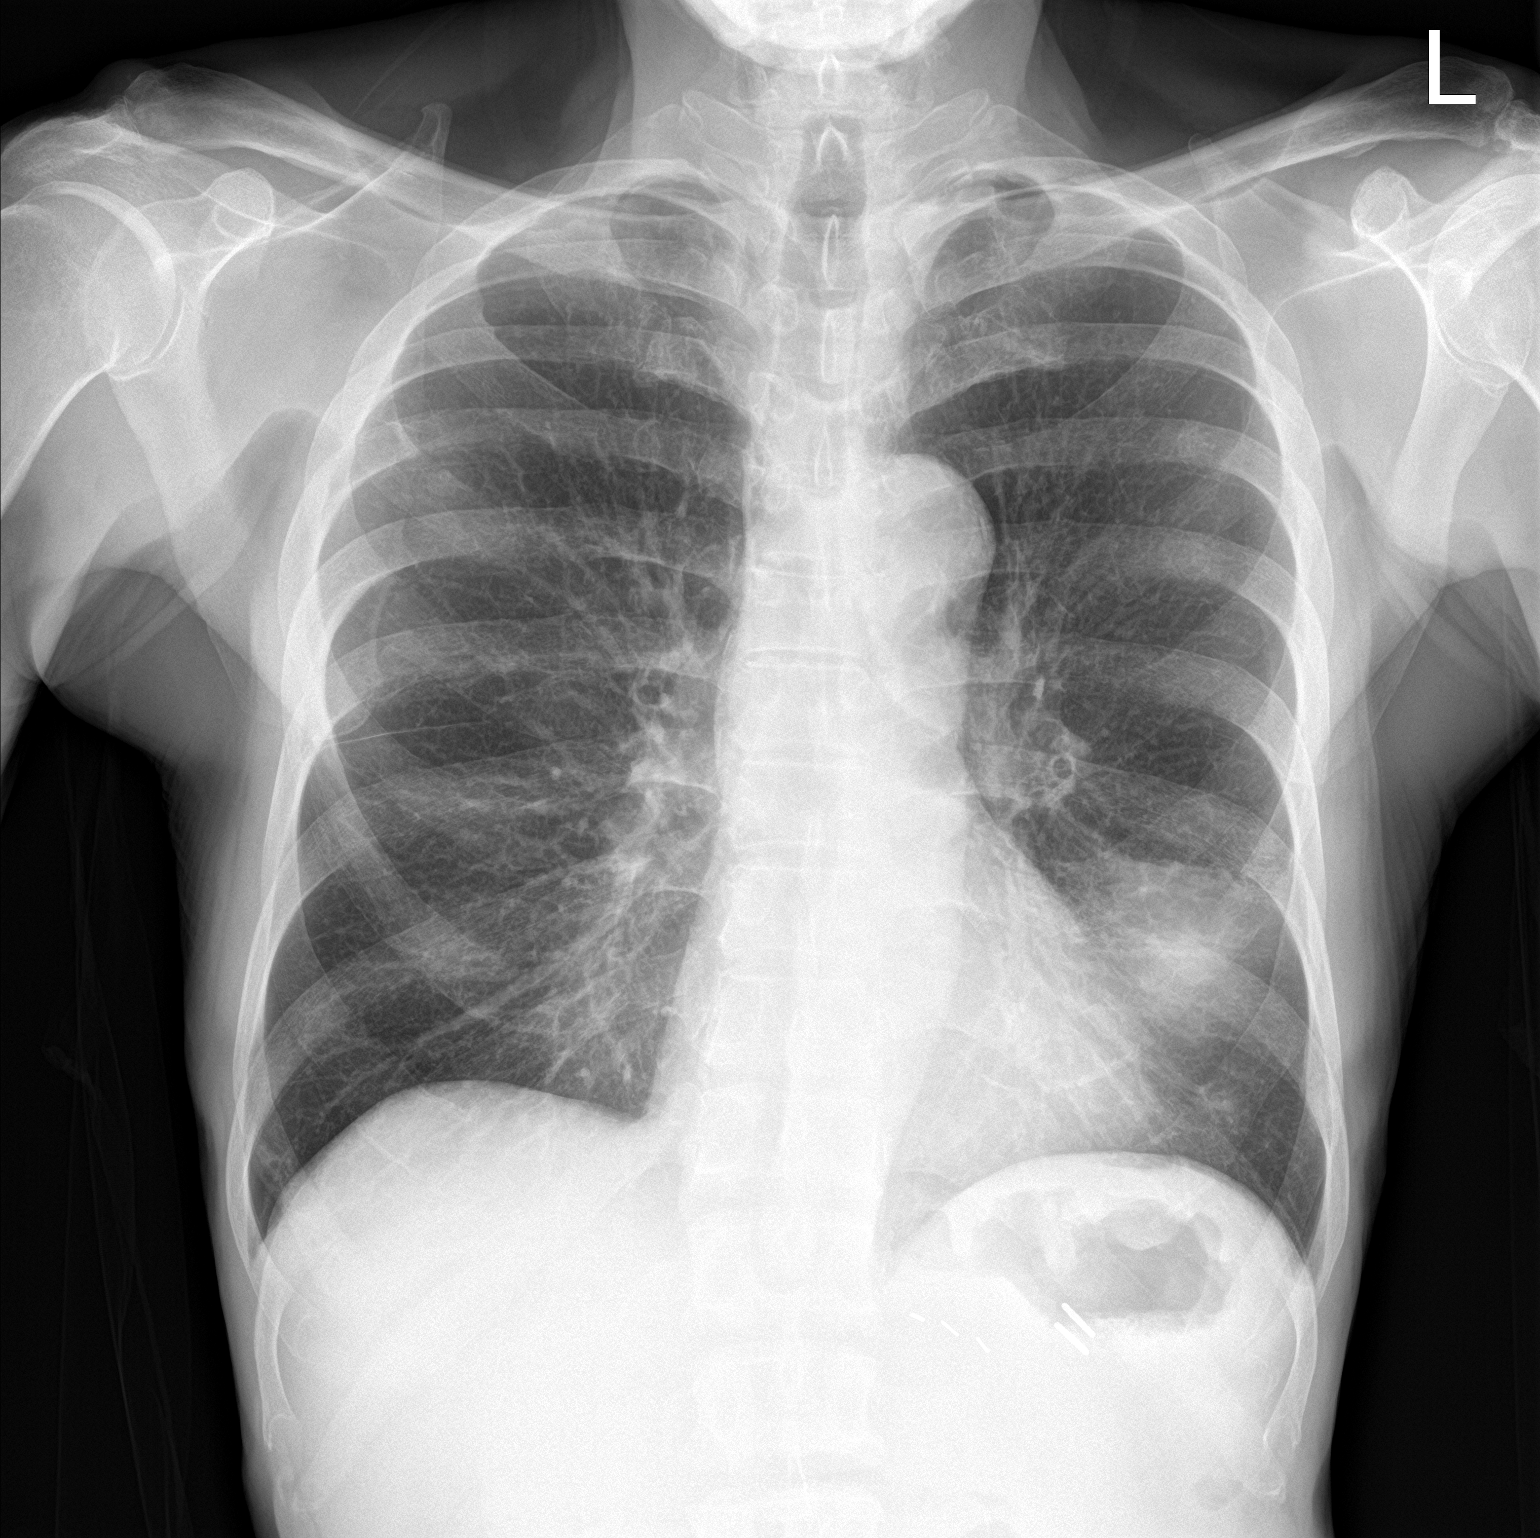

[chest lat]
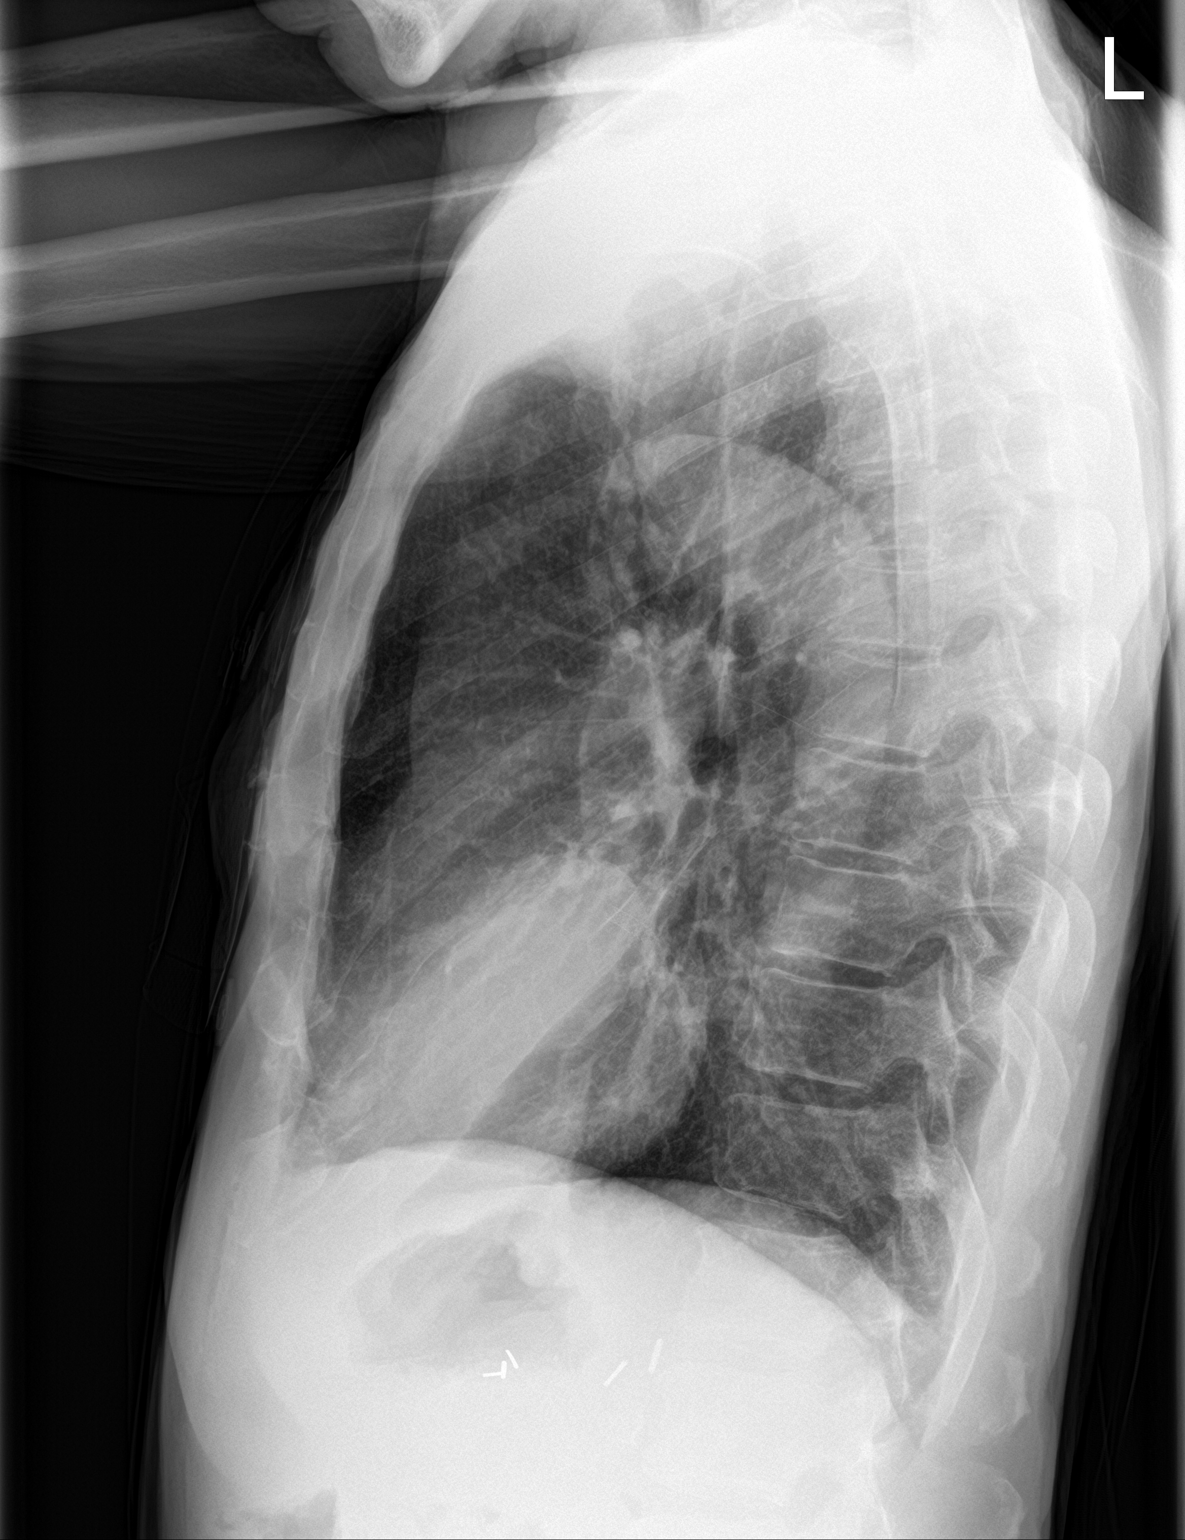

[2 of 2 positions shown; findings below may reference images not displayed]

FINDINGS: The lungs are emphysematous. Airspace disease in the lingula is
again seen. The right lung is clear. Heart size is normal. No
pneumothorax or pleural fluid. Aortic atherosclerosis noted.
IMPRESSION: Airspace disease in the lingula is unchanged since the exam 5 days
ago and is likely due to pneumonia. Recommend follow-up plain films
to clearing.

Aortic Atherosclerosis (UK1TC-EC5.5) and Emphysema (UK1TC-3GO.M).

## 2021-08-16 ENCOUNTER — Other Ambulatory Visit: Payer: Self-pay | Admitting: Internal Medicine

## 2021-08-17 ENCOUNTER — Inpatient Hospital Stay: Payer: Medicare PPO

## 2021-08-17 ENCOUNTER — Encounter: Payer: Self-pay | Admitting: Internal Medicine

## 2021-08-17 ENCOUNTER — Other Ambulatory Visit: Payer: Self-pay

## 2021-08-17 ENCOUNTER — Ambulatory Visit
Admission: RE | Admit: 2021-08-17 | Discharge: 2021-08-17 | Disposition: A | Discharge status: Home / Self Care | Payer: Medicare PPO | Source: Ambulatory Visit | Attending: Radiation Oncology | Admitting: Radiation Oncology

## 2021-08-17 ENCOUNTER — Other Ambulatory Visit: Payer: Self-pay | Admitting: Internal Medicine

## 2021-08-17 VITALS — BP 99/65 | HR 68 | Temp 97.8°F | Resp 16 | Wt 118.0 lb

## 2021-08-17 DIAGNOSIS — C3412 Malignant neoplasm of upper lobe, left bronchus or lung: Secondary | ICD-10-CM | POA: Diagnosis not present

## 2021-08-17 DIAGNOSIS — I252 Old myocardial infarction: Secondary | ICD-10-CM | POA: Diagnosis not present

## 2021-08-17 DIAGNOSIS — Z923 Personal history of irradiation: Secondary | ICD-10-CM | POA: Diagnosis not present

## 2021-08-17 DIAGNOSIS — C3492 Malignant neoplasm of unspecified part of left bronchus or lung: Secondary | ICD-10-CM

## 2021-08-17 DIAGNOSIS — Z7984 Long term (current) use of oral hypoglycemic drugs: Secondary | ICD-10-CM | POA: Diagnosis not present

## 2021-08-17 DIAGNOSIS — Z510 Encounter for antineoplastic radiation therapy: Secondary | ICD-10-CM | POA: Diagnosis not present

## 2021-08-17 DIAGNOSIS — Z79899 Other long term (current) drug therapy: Secondary | ICD-10-CM | POA: Diagnosis not present

## 2021-08-17 DIAGNOSIS — Z5111 Encounter for antineoplastic chemotherapy: Secondary | ICD-10-CM | POA: Diagnosis not present

## 2021-08-17 DIAGNOSIS — I10 Essential (primary) hypertension: Secondary | ICD-10-CM | POA: Diagnosis not present

## 2021-08-17 DIAGNOSIS — E1165 Type 2 diabetes mellitus with hyperglycemia: Secondary | ICD-10-CM | POA: Diagnosis not present

## 2021-08-17 LAB — CBC WITH DIFFERENTIAL (CANCER CENTER ONLY)
Abs Immature Granulocytes: 1.03 10*3/uL — ABNORMAL HIGH (ref 0.00–0.07)
Basophils Absolute: 0.2 10*3/uL — ABNORMAL HIGH (ref 0.0–0.1)
Basophils Relative: 0 %
Eosinophils Absolute: 0 10*3/uL (ref 0.0–0.5)
Eosinophils Relative: 0 %
HCT: 30.2 % — ABNORMAL LOW (ref 39.0–52.0)
Hemoglobin: 9.4 g/dL — ABNORMAL LOW (ref 13.0–17.0)
Immature Granulocytes: 2 %
Lymphocytes Relative: 1 %
Lymphs Abs: 0.5 10*3/uL — ABNORMAL LOW (ref 0.7–4.0)
MCH: 26.9 pg (ref 26.0–34.0)
MCHC: 31.1 g/dL (ref 30.0–36.0)
MCV: 86.3 fL (ref 80.0–100.0)
Monocytes Absolute: 1.2 10*3/uL — ABNORMAL HIGH (ref 0.1–1.0)
Monocytes Relative: 3 %
Neutro Abs: 40.3 10*3/uL — ABNORMAL HIGH (ref 1.7–7.7)
Neutrophils Relative %: 94 %
Platelet Count: 320 10*3/uL (ref 150–400)
RBC: 3.5 MIL/uL — ABNORMAL LOW (ref 4.22–5.81)
RDW: 16.3 % — ABNORMAL HIGH (ref 11.5–15.5)
WBC Count: 43.2 10*3/uL — ABNORMAL HIGH (ref 4.0–10.5)
nRBC: 0 % (ref 0.0–0.2)

## 2021-08-17 LAB — CMP (CANCER CENTER ONLY)
ALT: 14 U/L (ref 0–44)
AST: 12 U/L — ABNORMAL LOW (ref 15–41)
Albumin: 2.6 g/dL — ABNORMAL LOW (ref 3.5–5.0)
Alkaline Phosphatase: 219 U/L — ABNORMAL HIGH (ref 38–126)
Anion gap: 12 (ref 5–15)
BUN: 11 mg/dL (ref 8–23)
CO2: 25 mmol/L (ref 22–32)
Calcium: 8.8 mg/dL — ABNORMAL LOW (ref 8.9–10.3)
Chloride: 100 mmol/L (ref 98–111)
Creatinine: 1.1 mg/dL (ref 0.61–1.24)
GFR, Estimated: >60 mL/min (ref >60)
Glucose, Bld: 400 mg/dL — ABNORMAL HIGH (ref 70–99)
Potassium: 3.9 mmol/L (ref 3.5–5.1)
Sodium: 137 mmol/L (ref 135–145)
Total Bilirubin: 0.7 mg/dL (ref 0.3–1.2)
Total Protein: 7.2 g/dL (ref 6.5–8.1)

## 2021-08-17 MED ORDER — SODIUM CHLORIDE 0.9 % IV SOLN
10.0000 mg | Freq: Once | INTRAVENOUS | Status: AC
  Administered 2021-08-17: 10 mg via INTRAVENOUS
  Filled 2021-08-17: qty 10

## 2021-08-17 MED ORDER — DIPHENHYDRAMINE HCL 50 MG/ML IJ SOLN
50.0000 mg | Freq: Once | INTRAMUSCULAR | Status: AC
  Administered 2021-08-17: 50 mg via INTRAVENOUS
  Filled 2021-08-17: qty 1

## 2021-08-17 MED ORDER — SODIUM CHLORIDE 0.9 % IV SOLN
45.0000 mg/m2 | Freq: Once | INTRAVENOUS | Status: AC
  Administered 2021-08-17: 78 mg via INTRAVENOUS
  Filled 2021-08-17: qty 13

## 2021-08-17 MED ORDER — SODIUM CHLORIDE 0.9 % IV SOLN
145.8000 mg | Freq: Once | INTRAVENOUS | Status: AC
  Administered 2021-08-17: 150 mg via INTRAVENOUS
  Filled 2021-08-17: qty 15

## 2021-08-17 MED ORDER — FAMOTIDINE 20 MG IN NS 100 ML IVPB
20.0000 mg | Freq: Once | INTRAVENOUS | Status: AC
  Administered 2021-08-17: 20 mg via INTRAVENOUS
  Filled 2021-08-17: qty 100

## 2021-08-17 MED ORDER — PALONOSETRON HCL INJECTION 0.25 MG/5ML
0.2500 mg | Freq: Once | INTRAVENOUS | Status: AC
  Administered 2021-08-17: 0.25 mg via INTRAVENOUS
  Filled 2021-08-17: qty 5

## 2021-08-17 MED ORDER — SODIUM CHLORIDE 0.9 % IV SOLN: Freq: Once | INTRAVENOUS | Status: AC

## 2021-08-17 NOTE — Patient Instructions (Signed)
[facility]   Discharge Instructions: Thank you for choosing [facility] to provide your oncology and hematology care.   If you have a lab appointment with the Cancer Center, please go directly to the Cancer Center and check in at the registration area.   Wear comfortable clothing and clothing appropriate for easy access to any Portacath or PICC line.   We strive to give you quality time with your provider. You may need to reschedule your appointment if you arrive late (15 or more minutes).  Arriving late affects you and other patients whose appointments are after yours.  Also, if you miss three or more appointments without notifying the office, you may be dismissed from the clinic at the provider's discretion.      For prescription refill requests, have your pharmacy contact our office and allow 72 hours for refills to be completed.    Today you received the following chemotherapy and/or immunotherapy agents: paclitaxel and carboplatin.      To help prevent nausea and vomiting after your treatment, we encourage you to take your nausea medication as directed.  BELOW ARE SYMPTOMS THAT SHOULD BE REPORTED IMMEDIATELY: *FEVER GREATER THAN 100.4 F (38 C) OR HIGHER *CHILLS OR SWEATING *NAUSEA AND VOMITING THAT IS NOT CONTROLLED WITH YOUR NAUSEA MEDICATION *UNUSUAL SHORTNESS OF BREATH *UNUSUAL BRUISING OR BLEEDING *URINARY PROBLEMS (pain or burning when urinating, or frequent urination) *BOWEL PROBLEMS (unusual diarrhea, constipation, pain near the anus) TENDERNESS IN MOUTH AND THROAT WITH OR WITHOUT PRESENCE OF ULCERS (sore throat, sores in mouth, or a toothache) UNUSUAL RASH, SWELLING OR PAIN  UNUSUAL VAGINAL DISCHARGE OR ITCHING   Items with * indicate a potential emergency and should be followed up as soon as possible or go to the Emergency Department if any problems should occur.  Please show the CHEMOTHERAPY ALERT CARD or IMMUNOTHERAPY ALERT  CARD at check-in to the Emergency Department and triage nurse.  Should you have questions after your visit or need to cancel or reschedule your appointment, please contact [facility]  Dept: [phone]  and follow the prompts.  Office hours are 8:00 a.m. to 4:30 p.m. Monday - Friday. Please note that voicemails left after 4:00 p.m. may not be returned until the following business day.  We are closed weekends and major holidays. You have access to a nurse at all times for urgent questions. Please call the main number to the clinic Dept: [phone] and follow the prompts.   For any non-urgent questions, you may also contact your provider using MyChart. We now offer e-Visits for anyone 18 and older to request care online for non-urgent symptoms. For details visit [email or web address].   Also download the MyChart app! Go to the app store, search "MyChart", open the app, select , and log in with your MyChart username and password.  Due to Covid, a mask is required upon entering the hospital/clinic. If you do not have a mask, one will be given to you upon arrival. For doctor visits, patients may have 1 support person aged 18 or older with them. For treatment visits, patients cannot have anyone with them due to current Covid guidelines and our immunocompromised population.   

## 2021-08-18 ENCOUNTER — Ambulatory Visit
Admission: RE | Admit: 2021-08-18 | Discharge: 2021-08-18 | Disposition: A | Discharge status: Home / Self Care | Payer: Medicare PPO | Source: Ambulatory Visit | Attending: Radiation Oncology | Admitting: Radiation Oncology

## 2021-08-18 DIAGNOSIS — Z510 Encounter for antineoplastic radiation therapy: Secondary | ICD-10-CM | POA: Diagnosis not present

## 2021-08-18 DIAGNOSIS — Z7984 Long term (current) use of oral hypoglycemic drugs: Secondary | ICD-10-CM | POA: Diagnosis not present

## 2021-08-18 DIAGNOSIS — C3412 Malignant neoplasm of upper lobe, left bronchus or lung: Secondary | ICD-10-CM | POA: Diagnosis not present

## 2021-08-18 DIAGNOSIS — E1165 Type 2 diabetes mellitus with hyperglycemia: Secondary | ICD-10-CM | POA: Diagnosis not present

## 2021-08-19 ENCOUNTER — Ambulatory Visit
Admission: RE | Admit: 2021-08-19 | Discharge: 2021-08-19 | Disposition: A | Discharge status: Home / Self Care | Payer: Medicare PPO | Source: Ambulatory Visit | Attending: Radiation Oncology | Admitting: Radiation Oncology

## 2021-08-19 ENCOUNTER — Other Ambulatory Visit: Payer: Self-pay

## 2021-08-19 DIAGNOSIS — E1165 Type 2 diabetes mellitus with hyperglycemia: Secondary | ICD-10-CM | POA: Diagnosis not present

## 2021-08-19 DIAGNOSIS — Z510 Encounter for antineoplastic radiation therapy: Secondary | ICD-10-CM | POA: Diagnosis not present

## 2021-08-19 DIAGNOSIS — Z7984 Long term (current) use of oral hypoglycemic drugs: Secondary | ICD-10-CM | POA: Diagnosis not present

## 2021-08-19 DIAGNOSIS — C3412 Malignant neoplasm of upper lobe, left bronchus or lung: Secondary | ICD-10-CM | POA: Diagnosis not present

## 2021-08-20 ENCOUNTER — Ambulatory Visit
Admission: RE | Admit: 2021-08-20 | Discharge: 2021-08-20 | Disposition: A | Discharge status: Home / Self Care | Payer: Medicare PPO | Source: Ambulatory Visit | Attending: Radiation Oncology | Admitting: Radiation Oncology

## 2021-08-20 DIAGNOSIS — C3412 Malignant neoplasm of upper lobe, left bronchus or lung: Secondary | ICD-10-CM | POA: Diagnosis not present

## 2021-08-20 DIAGNOSIS — Z7984 Long term (current) use of oral hypoglycemic drugs: Secondary | ICD-10-CM | POA: Diagnosis not present

## 2021-08-20 DIAGNOSIS — Z510 Encounter for antineoplastic radiation therapy: Secondary | ICD-10-CM | POA: Diagnosis not present

## 2021-08-20 DIAGNOSIS — E1165 Type 2 diabetes mellitus with hyperglycemia: Secondary | ICD-10-CM | POA: Diagnosis not present

## 2021-08-21 ENCOUNTER — Ambulatory Visit
Admission: RE | Admit: 2021-08-21 | Discharge: 2021-08-21 | Disposition: A | Discharge status: Home / Self Care | Payer: Medicare PPO | Source: Ambulatory Visit | Attending: Radiation Oncology | Admitting: Radiation Oncology

## 2021-08-21 ENCOUNTER — Other Ambulatory Visit: Payer: Self-pay

## 2021-08-21 DIAGNOSIS — C3412 Malignant neoplasm of upper lobe, left bronchus or lung: Secondary | ICD-10-CM | POA: Diagnosis not present

## 2021-08-21 DIAGNOSIS — Z510 Encounter for antineoplastic radiation therapy: Secondary | ICD-10-CM | POA: Diagnosis not present

## 2021-08-21 DIAGNOSIS — Z7984 Long term (current) use of oral hypoglycemic drugs: Secondary | ICD-10-CM | POA: Diagnosis not present

## 2021-08-21 DIAGNOSIS — E1165 Type 2 diabetes mellitus with hyperglycemia: Secondary | ICD-10-CM | POA: Diagnosis not present

## 2021-08-21 MED FILL — Dexamethasone Sodium Phosphate Inj 100 MG/10ML: INTRAMUSCULAR | Qty: 1 | Status: AC

## 2021-08-22 ENCOUNTER — Other Ambulatory Visit: Payer: Self-pay | Admitting: Physician Assistant

## 2021-08-22 DIAGNOSIS — R1310 Dysphagia, unspecified: Secondary | ICD-10-CM

## 2021-08-24 ENCOUNTER — Other Ambulatory Visit: Payer: Self-pay

## 2021-08-24 ENCOUNTER — Ambulatory Visit
Admission: RE | Admit: 2021-08-24 | Discharge: 2021-08-24 | Disposition: A | Discharge status: Home / Self Care | Payer: Medicare PPO | Source: Ambulatory Visit | Attending: Radiation Oncology | Admitting: Radiation Oncology

## 2021-08-24 ENCOUNTER — Inpatient Hospital Stay: Payer: Medicare PPO

## 2021-08-24 ENCOUNTER — Inpatient Hospital Stay (HOSPITAL_BASED_OUTPATIENT_CLINIC_OR_DEPARTMENT_OTHER): Payer: Medicare PPO | Admitting: Internal Medicine

## 2021-08-24 ENCOUNTER — Inpatient Hospital Stay: Payer: Medicare PPO | Admitting: Nutrition

## 2021-08-24 VITALS — BP 106/55 | HR 69 | Temp 98.6°F | Resp 18 | Wt 118.4 lb

## 2021-08-24 VITALS — BP 132/59 | HR 80 | Resp 17

## 2021-08-24 DIAGNOSIS — C3412 Malignant neoplasm of upper lobe, left bronchus or lung: Secondary | ICD-10-CM | POA: Diagnosis not present

## 2021-08-24 DIAGNOSIS — Z7984 Long term (current) use of oral hypoglycemic drugs: Secondary | ICD-10-CM | POA: Diagnosis not present

## 2021-08-24 DIAGNOSIS — E1165 Type 2 diabetes mellitus with hyperglycemia: Secondary | ICD-10-CM | POA: Diagnosis not present

## 2021-08-24 DIAGNOSIS — Z923 Personal history of irradiation: Secondary | ICD-10-CM | POA: Diagnosis not present

## 2021-08-24 DIAGNOSIS — C3492 Malignant neoplasm of unspecified part of left bronchus or lung: Secondary | ICD-10-CM

## 2021-08-24 DIAGNOSIS — Z5111 Encounter for antineoplastic chemotherapy: Secondary | ICD-10-CM | POA: Diagnosis not present

## 2021-08-24 DIAGNOSIS — Z510 Encounter for antineoplastic radiation therapy: Secondary | ICD-10-CM | POA: Diagnosis not present

## 2021-08-24 DIAGNOSIS — I252 Old myocardial infarction: Secondary | ICD-10-CM | POA: Diagnosis not present

## 2021-08-24 DIAGNOSIS — Z79899 Other long term (current) drug therapy: Secondary | ICD-10-CM | POA: Diagnosis not present

## 2021-08-24 DIAGNOSIS — I10 Essential (primary) hypertension: Secondary | ICD-10-CM | POA: Diagnosis not present

## 2021-08-24 LAB — CBC WITH DIFFERENTIAL (CANCER CENTER ONLY)
Abs Immature Granulocytes: 0.59 10*3/uL — ABNORMAL HIGH (ref 0.00–0.07)
Basophils Absolute: 0.1 10*3/uL (ref 0.0–0.1)
Basophils Relative: 0 %
Eosinophils Absolute: 0 10*3/uL (ref 0.0–0.5)
Eosinophils Relative: 0 %
HCT: 28.3 % — ABNORMAL LOW (ref 39.0–52.0)
Hemoglobin: 9.2 g/dL — ABNORMAL LOW (ref 13.0–17.0)
Immature Granulocytes: 2 %
Lymphocytes Relative: 1 %
Lymphs Abs: 0.4 10*3/uL — ABNORMAL LOW (ref 0.7–4.0)
MCH: 27.3 pg (ref 26.0–34.0)
MCHC: 32.5 g/dL (ref 30.0–36.0)
MCV: 84 fL (ref 80.0–100.0)
Monocytes Absolute: 0.9 10*3/uL (ref 0.1–1.0)
Monocytes Relative: 3 %
Neutro Abs: 27.9 10*3/uL — ABNORMAL HIGH (ref 1.7–7.7)
Neutrophils Relative %: 94 %
Platelet Count: 249 10*3/uL (ref 150–400)
RBC: 3.37 MIL/uL — ABNORMAL LOW (ref 4.22–5.81)
RDW: 16.4 % — ABNORMAL HIGH (ref 11.5–15.5)
Smear Review: NORMAL
WBC Count: 29.8 10*3/uL — ABNORMAL HIGH (ref 4.0–10.5)
nRBC: 0 % (ref 0.0–0.2)

## 2021-08-24 LAB — CMP (CANCER CENTER ONLY)
ALT: 13 U/L (ref 0–44)
AST: 8 U/L — ABNORMAL LOW (ref 15–41)
Albumin: 2.7 g/dL — ABNORMAL LOW (ref 3.5–5.0)
Alkaline Phosphatase: 190 U/L — ABNORMAL HIGH (ref 38–126)
Anion gap: 11 (ref 5–15)
BUN: 8 mg/dL (ref 8–23)
CO2: 26 mmol/L (ref 22–32)
Calcium: 8.9 mg/dL (ref 8.9–10.3)
Chloride: 101 mmol/L (ref 98–111)
Creatinine: 0.8 mg/dL (ref 0.61–1.24)
GFR, Estimated: >60 mL/min (ref >60)
Glucose, Bld: 286 mg/dL — ABNORMAL HIGH (ref 70–99)
Potassium: 3.7 mmol/L (ref 3.5–5.1)
Sodium: 138 mmol/L (ref 135–145)
Total Bilirubin: 0.6 mg/dL (ref 0.3–1.2)
Total Protein: 7 g/dL (ref 6.5–8.1)

## 2021-08-24 MED ORDER — DIPHENHYDRAMINE HCL 50 MG/ML IJ SOLN
50.0000 mg | Freq: Once | INTRAMUSCULAR | Status: AC
  Administered 2021-08-24: 50 mg via INTRAVENOUS
  Filled 2021-08-24: qty 1

## 2021-08-24 MED ORDER — SODIUM CHLORIDE 0.9 % IV SOLN
10.0000 mg | Freq: Once | INTRAVENOUS | Status: AC
  Administered 2021-08-24: 10 mg via INTRAVENOUS
  Filled 2021-08-24: qty 10

## 2021-08-24 MED ORDER — PALONOSETRON HCL INJECTION 0.25 MG/5ML
0.2500 mg | Freq: Once | INTRAVENOUS | Status: AC
  Administered 2021-08-24: 0.25 mg via INTRAVENOUS
  Filled 2021-08-24: qty 5

## 2021-08-24 MED ORDER — FAMOTIDINE 20 MG IN NS 100 ML IVPB
20.0000 mg | Freq: Once | INTRAVENOUS | Status: AC
Start: 2021-08-24 — End: 2021-08-24
  Administered 2021-08-24: 20 mg via INTRAVENOUS
  Filled 2021-08-24: qty 100

## 2021-08-24 MED ORDER — SODIUM CHLORIDE 0.9 % IV SOLN
145.8000 mg | Freq: Once | INTRAVENOUS | Status: AC
  Administered 2021-08-24: 150 mg via INTRAVENOUS
  Filled 2021-08-24: qty 15

## 2021-08-24 MED ORDER — SODIUM CHLORIDE 0.9 % IV SOLN: Freq: Once | INTRAVENOUS | Status: AC

## 2021-08-24 MED ORDER — SODIUM CHLORIDE 0.9 % IV SOLN
45.0000 mg/m2 | Freq: Once | INTRAVENOUS | Status: AC
  Administered 2021-08-24: 78 mg via INTRAVENOUS
  Filled 2021-08-24: qty 13

## 2021-08-24 NOTE — Progress Notes (Addendum)
75 year old male diagnosed with lung cancer receiving concurrent chemoradiation therapy utilizing carbo/Taxol.  Past medical history includes CAD, hyperlipidemia, hypertension, prediabetes, and tobacco abuse.  Medications include Glucophage, Compazine, Carafate, Remeron.  Labs include glucose 286, albumin 2.7.  Height: 5 feet 11 inches. Weight: 118.3 pounds September 26. Usual body weight: 146 pounds in March 2022. BMI: 16.51. (underweight)  Patient reports he has a bad appetite.  He has taste alterations and dysphagia.  He denies nausea and vomiting.  Reports he has a bowel movement once a week and admits this is constipation. He will drink Ensure original or high-protein but dislikes Ensure Plus.  He denies difficulty getting ONS. Reports he does not tolerate boost plus.  He has not tried Ensure complete.  Nutrition diagnosis: Unintended weight loss related to lung cancer and associated treatments as evidenced by approximate 19% weight loss over 6 months which is significant.  Intervention: Educated patient to consume smaller more frequent meals and snacks with adequate calories and protein. Recommended patient try Ensure complete and drink twice daily between meals.  Provided samples and coupons. Educated patient on strategies to improve taste alterations. Recommended 4 ounces prune juice daily to improve constipation.  Consider medications per MD. Nutrition fact sheets provided.  Questions were answered.  Teach back method used.  Contact information given.  Monitoring, evaluation, goals: Patient will tolerate adequate calories and protein to minimize further weight loss.  Next visit:Tuesday, Oct 11 with Vinnie Level.  **Disclaimer: This note was dictated with voice recognition software. Similar sounding words can inadvertently be transcribed and this note may contain transcription errors which may not have been corrected upon publication of note.**
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
## 2021-08-24 NOTE — Patient Instructions (Signed)
[facility]  Discharge Instructions: Thank you for choosing [facility] to provide your oncology and hematology care.   If you have a lab appointment with the [facility], please go directly to the [facility] and check in at the registration area.   Wear comfortable clothing and clothing appropriate for easy access to any Portacath or PICC line.   We strive to give you quality time with your provider. You may need to reschedule your appointment if you arrive late (15 or more minutes).  Arriving late affects you and other patients whose appointments are after yours.  Also, if you miss three or more appointments without notifying the office, you may be dismissed from the clinic at the provider's discretion.      For prescription refill requests, have your pharmacy contact our office and allow 72 hours for refills to be completed.    Today you received the following chemotherapy and/or immunotherapy agents : Taxol, Carboplatin      To help prevent nausea and vomiting after your treatment, we encourage you to take your nausea medication as directed.  BELOW ARE SYMPTOMS THAT SHOULD BE REPORTED IMMEDIATELY: *FEVER GREATER THAN 100.4 F (38 C) OR HIGHER *CHILLS OR SWEATING *NAUSEA AND VOMITING THAT IS NOT CONTROLLED WITH YOUR NAUSEA MEDICATION *UNUSUAL SHORTNESS OF BREATH *UNUSUAL BRUISING OR BLEEDING *URINARY PROBLEMS (pain or burning when urinating, or frequent urination) *BOWEL PROBLEMS (unusual diarrhea, constipation, pain near the anus) TENDERNESS IN MOUTH AND THROAT WITH OR WITHOUT PRESENCE OF ULCERS (sore throat, sores in mouth, or a toothache) UNUSUAL RASH, SWELLING OR PAIN  UNUSUAL VAGINAL DISCHARGE OR ITCHING   Items with * indicate a potential emergency and should be followed up as soon as possible or go to the Emergency Department if any problems should occur.  Please show the CHEMOTHERAPY ALERT CARD or IMMUNOTHERAPY ALERT CARD at  check-in to the Emergency Department and triage nurse.  Should you have questions after your visit or need to cancel or reschedule your appointment, please contact [facility]  Dept: [phone]  and follow the prompts.  Office hours are 8:00 a.m. to 4:30 p.m. Monday - Friday. Please note that voicemails left after 4:00 p.m. may not be returned until the following business day.  We are closed weekends and major holidays. You have access to a nurse at all times for urgent questions. Please call the main number to the clinic Dept: [phone] and follow the prompts.   For any non-urgent questions, you may also contact your provider using MyChart. We now offer e-Visits for anyone 85 and older to request care online for non-urgent symptoms. For details visit [email or web address].   Also download the MyChart app! Go to the app store, search "MyChart", open the app, select [facility], and log in with your MyChart username and password.  Due to Covid, a mask is required upon entering the hospital/clinic. If you do not have a mask, one will be given to you upon arrival. For doctor visits, patients may have 1 support person aged 1 or older with them. For treatment visits, patients cannot have anyone with them due to current Covid guidelines and our immunocompromised population.

## 2021-08-25 ENCOUNTER — Ambulatory Visit
Admission: RE | Admit: 2021-08-25 | Discharge: 2021-08-25 | Disposition: A | Discharge status: Home / Self Care | Payer: Medicare PPO | Source: Ambulatory Visit | Attending: Radiation Oncology | Admitting: Radiation Oncology

## 2021-08-25 DIAGNOSIS — E1165 Type 2 diabetes mellitus with hyperglycemia: Secondary | ICD-10-CM | POA: Diagnosis not present

## 2021-08-25 DIAGNOSIS — Z7984 Long term (current) use of oral hypoglycemic drugs: Secondary | ICD-10-CM | POA: Diagnosis not present

## 2021-08-25 DIAGNOSIS — Z510 Encounter for antineoplastic radiation therapy: Secondary | ICD-10-CM | POA: Diagnosis not present

## 2021-08-25 DIAGNOSIS — C3412 Malignant neoplasm of upper lobe, left bronchus or lung: Secondary | ICD-10-CM | POA: Diagnosis not present

## 2021-08-26 ENCOUNTER — Ambulatory Visit
Admission: RE | Admit: 2021-08-26 | Discharge: 2021-08-26 | Disposition: A | Discharge status: Home / Self Care | Payer: Medicare PPO | Source: Ambulatory Visit | Attending: Radiation Oncology | Admitting: Radiation Oncology

## 2021-08-26 ENCOUNTER — Other Ambulatory Visit: Payer: Self-pay

## 2021-08-26 ENCOUNTER — Other Ambulatory Visit: Payer: Self-pay | Admitting: Physician Assistant

## 2021-08-26 DIAGNOSIS — Z510 Encounter for antineoplastic radiation therapy: Secondary | ICD-10-CM | POA: Diagnosis not present

## 2021-08-26 DIAGNOSIS — E1165 Type 2 diabetes mellitus with hyperglycemia: Secondary | ICD-10-CM | POA: Diagnosis not present

## 2021-08-26 DIAGNOSIS — R1310 Dysphagia, unspecified: Secondary | ICD-10-CM

## 2021-08-26 DIAGNOSIS — C3412 Malignant neoplasm of upper lobe, left bronchus or lung: Secondary | ICD-10-CM | POA: Diagnosis not present

## 2021-08-26 DIAGNOSIS — Z7984 Long term (current) use of oral hypoglycemic drugs: Secondary | ICD-10-CM | POA: Diagnosis not present

## 2021-08-27 ENCOUNTER — Ambulatory Visit
Admission: RE | Admit: 2021-08-27 | Discharge: 2021-08-27 | Disposition: A | Discharge status: Home / Self Care | Payer: Medicare PPO | Source: Ambulatory Visit | Attending: Radiation Oncology | Admitting: Radiation Oncology

## 2021-08-27 DIAGNOSIS — Z510 Encounter for antineoplastic radiation therapy: Secondary | ICD-10-CM | POA: Diagnosis not present

## 2021-08-27 DIAGNOSIS — C3412 Malignant neoplasm of upper lobe, left bronchus or lung: Secondary | ICD-10-CM | POA: Diagnosis not present

## 2021-08-27 DIAGNOSIS — E1165 Type 2 diabetes mellitus with hyperglycemia: Secondary | ICD-10-CM | POA: Diagnosis not present

## 2021-08-27 DIAGNOSIS — Z7984 Long term (current) use of oral hypoglycemic drugs: Secondary | ICD-10-CM | POA: Diagnosis not present

## 2021-08-28 ENCOUNTER — Ambulatory Visit
Admission: RE | Admit: 2021-08-28 | Discharge: 2021-08-28 | Disposition: A | Discharge status: Home / Self Care | Payer: Medicare PPO | Source: Ambulatory Visit | Attending: Radiation Oncology | Admitting: Radiation Oncology

## 2021-08-28 ENCOUNTER — Other Ambulatory Visit: Payer: Self-pay | Admitting: Internal Medicine

## 2021-08-28 ENCOUNTER — Other Ambulatory Visit: Payer: Self-pay

## 2021-08-28 DIAGNOSIS — C3412 Malignant neoplasm of upper lobe, left bronchus or lung: Secondary | ICD-10-CM | POA: Diagnosis not present

## 2021-08-28 DIAGNOSIS — Z7984 Long term (current) use of oral hypoglycemic drugs: Secondary | ICD-10-CM | POA: Diagnosis not present

## 2021-08-28 DIAGNOSIS — Z510 Encounter for antineoplastic radiation therapy: Secondary | ICD-10-CM | POA: Diagnosis not present

## 2021-08-28 DIAGNOSIS — E1165 Type 2 diabetes mellitus with hyperglycemia: Secondary | ICD-10-CM | POA: Diagnosis not present

## 2021-08-28 DIAGNOSIS — C3492 Malignant neoplasm of unspecified part of left bronchus or lung: Secondary | ICD-10-CM

## 2021-08-28 MED FILL — Dexamethasone Sodium Phosphate Inj 100 MG/10ML: INTRAMUSCULAR | Qty: 1 | Status: AC

## 2021-08-31 ENCOUNTER — Ambulatory Visit: Payer: Medicare PPO

## 2021-08-31 ENCOUNTER — Inpatient Hospital Stay: Payer: Medicare PPO

## 2021-08-31 DIAGNOSIS — C3412 Malignant neoplasm of upper lobe, left bronchus or lung: Secondary | ICD-10-CM | POA: Insufficient documentation

## 2021-08-31 DIAGNOSIS — Z5111 Encounter for antineoplastic chemotherapy: Secondary | ICD-10-CM | POA: Insufficient documentation

## 2021-09-01 ENCOUNTER — Ambulatory Visit
Admission: RE | Admit: 2021-09-01 | Discharge: 2021-09-01 | Disposition: A | Discharge status: Home / Self Care | Payer: Medicare PPO | Source: Ambulatory Visit | Attending: Radiation Oncology | Admitting: Radiation Oncology

## 2021-09-01 ENCOUNTER — Other Ambulatory Visit: Payer: Self-pay

## 2021-09-01 DIAGNOSIS — C3412 Malignant neoplasm of upper lobe, left bronchus or lung: Secondary | ICD-10-CM | POA: Diagnosis not present

## 2021-09-01 DIAGNOSIS — Z510 Encounter for antineoplastic radiation therapy: Secondary | ICD-10-CM | POA: Insufficient documentation

## 2021-09-01 DIAGNOSIS — E1165 Type 2 diabetes mellitus with hyperglycemia: Secondary | ICD-10-CM | POA: Diagnosis not present

## 2021-09-01 DIAGNOSIS — Z7984 Long term (current) use of oral hypoglycemic drugs: Secondary | ICD-10-CM | POA: Diagnosis not present
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
## 2021-09-02 ENCOUNTER — Ambulatory Visit
Admission: RE | Admit: 2021-09-02 | Discharge: 2021-09-02 | Disposition: A | Discharge status: Home / Self Care | Payer: Medicare PPO | Source: Ambulatory Visit | Attending: Radiation Oncology | Admitting: Radiation Oncology

## 2021-09-02 DIAGNOSIS — E1165 Type 2 diabetes mellitus with hyperglycemia: Secondary | ICD-10-CM | POA: Diagnosis not present

## 2021-09-02 DIAGNOSIS — C3412 Malignant neoplasm of upper lobe, left bronchus or lung: Secondary | ICD-10-CM | POA: Diagnosis not present

## 2021-09-02 DIAGNOSIS — Z510 Encounter for antineoplastic radiation therapy: Secondary | ICD-10-CM | POA: Diagnosis not present

## 2021-09-02 DIAGNOSIS — Z7984 Long term (current) use of oral hypoglycemic drugs: Secondary | ICD-10-CM | POA: Diagnosis not present

## 2021-09-03 ENCOUNTER — Ambulatory Visit
Admission: RE | Admit: 2021-09-03 | Discharge: 2021-09-03 | Disposition: A | Discharge status: Home / Self Care | Payer: Medicare PPO | Source: Ambulatory Visit | Attending: Radiation Oncology | Admitting: Radiation Oncology

## 2021-09-03 ENCOUNTER — Other Ambulatory Visit: Payer: Self-pay

## 2021-09-03 DIAGNOSIS — Z510 Encounter for antineoplastic radiation therapy: Secondary | ICD-10-CM | POA: Diagnosis not present

## 2021-09-03 DIAGNOSIS — C3412 Malignant neoplasm of upper lobe, left bronchus or lung: Secondary | ICD-10-CM | POA: Diagnosis not present

## 2021-09-03 DIAGNOSIS — Z7984 Long term (current) use of oral hypoglycemic drugs: Secondary | ICD-10-CM | POA: Diagnosis not present

## 2021-09-03 DIAGNOSIS — E1165 Type 2 diabetes mellitus with hyperglycemia: Secondary | ICD-10-CM | POA: Diagnosis not present

## 2021-09-04 ENCOUNTER — Ambulatory Visit
Admission: RE | Admit: 2021-09-04 | Discharge: 2021-09-04 | Disposition: A | Discharge status: Home / Self Care | Payer: Medicare PPO | Source: Ambulatory Visit | Attending: Radiation Oncology | Admitting: Radiation Oncology

## 2021-09-04 DIAGNOSIS — Z7984 Long term (current) use of oral hypoglycemic drugs: Secondary | ICD-10-CM | POA: Diagnosis not present

## 2021-09-04 DIAGNOSIS — E1165 Type 2 diabetes mellitus with hyperglycemia: Secondary | ICD-10-CM | POA: Diagnosis not present

## 2021-09-04 DIAGNOSIS — Z510 Encounter for antineoplastic radiation therapy: Secondary | ICD-10-CM | POA: Diagnosis not present

## 2021-09-04 DIAGNOSIS — C3412 Malignant neoplasm of upper lobe, left bronchus or lung: Secondary | ICD-10-CM | POA: Diagnosis not present

## 2021-09-07 ENCOUNTER — Ambulatory Visit
Admission: RE | Admit: 2021-09-07 | Discharge: 2021-09-07 | Disposition: A | Discharge status: Home / Self Care | Payer: Medicare PPO | Source: Ambulatory Visit | Attending: Radiation Oncology | Admitting: Radiation Oncology

## 2021-09-07 ENCOUNTER — Other Ambulatory Visit: Payer: Self-pay

## 2021-09-07 DIAGNOSIS — E1165 Type 2 diabetes mellitus with hyperglycemia: Secondary | ICD-10-CM | POA: Diagnosis not present

## 2021-09-07 DIAGNOSIS — Z7984 Long term (current) use of oral hypoglycemic drugs: Secondary | ICD-10-CM | POA: Diagnosis not present

## 2021-09-07 DIAGNOSIS — Z510 Encounter for antineoplastic radiation therapy: Secondary | ICD-10-CM | POA: Diagnosis not present

## 2021-09-07 DIAGNOSIS — C3412 Malignant neoplasm of upper lobe, left bronchus or lung: Secondary | ICD-10-CM | POA: Diagnosis not present

## 2021-09-07 MED FILL — Dexamethasone Sodium Phosphate Inj 100 MG/10ML: INTRAMUSCULAR | Qty: 1 | Status: AC

## 2021-09-08 ENCOUNTER — Other Ambulatory Visit: Payer: Self-pay | Admitting: Internal Medicine

## 2021-09-08 ENCOUNTER — Inpatient Hospital Stay (HOSPITAL_BASED_OUTPATIENT_CLINIC_OR_DEPARTMENT_OTHER): Payer: Medicare PPO | Admitting: Physician Assistant

## 2021-09-08 ENCOUNTER — Ambulatory Visit
Admission: RE | Admit: 2021-09-08 | Discharge: 2021-09-08 | Disposition: A | Discharge status: Home / Self Care | Payer: Medicare PPO | Source: Ambulatory Visit | Attending: Radiation Oncology | Admitting: Radiation Oncology

## 2021-09-08 ENCOUNTER — Inpatient Hospital Stay: Payer: Medicare PPO

## 2021-09-08 ENCOUNTER — Ambulatory Visit: Payer: Medicare PPO

## 2021-09-08 ENCOUNTER — Ambulatory Visit: Payer: Medicare PPO | Admitting: Radiation Oncology

## 2021-09-08 ENCOUNTER — Inpatient Hospital Stay: Payer: Medicare PPO | Admitting: Dietician

## 2021-09-08 ENCOUNTER — Ambulatory Visit: Payer: Medicare PPO | Admitting: Dietician

## 2021-09-08 VITALS — BP 98/66 | HR 105 | Temp 97.8°F | Resp 18 | Wt 120.5 lb

## 2021-09-08 VITALS — HR 96

## 2021-09-08 DIAGNOSIS — C3412 Malignant neoplasm of upper lobe, left bronchus or lung: Secondary | ICD-10-CM | POA: Diagnosis not present

## 2021-09-08 DIAGNOSIS — E860 Dehydration: Secondary | ICD-10-CM

## 2021-09-08 DIAGNOSIS — C3492 Malignant neoplasm of unspecified part of left bronchus or lung: Secondary | ICD-10-CM | POA: Diagnosis not present

## 2021-09-08 DIAGNOSIS — E1165 Type 2 diabetes mellitus with hyperglycemia: Secondary | ICD-10-CM | POA: Diagnosis not present

## 2021-09-08 DIAGNOSIS — Z7984 Long term (current) use of oral hypoglycemic drugs: Secondary | ICD-10-CM | POA: Diagnosis not present

## 2021-09-08 DIAGNOSIS — Z5111 Encounter for antineoplastic chemotherapy: Secondary | ICD-10-CM

## 2021-09-08 DIAGNOSIS — Z510 Encounter for antineoplastic radiation therapy: Secondary | ICD-10-CM | POA: Diagnosis not present

## 2021-09-08 LAB — CMP (CANCER CENTER ONLY)
ALT: 17 U/L (ref 0–44)
AST: 8 U/L — ABNORMAL LOW (ref 15–41)
Albumin: 2.6 g/dL — ABNORMAL LOW (ref 3.5–5.0)
Alkaline Phosphatase: 157 U/L — ABNORMAL HIGH (ref 38–126)
Anion gap: 11 (ref 5–15)
BUN: 7 mg/dL — ABNORMAL LOW (ref 8–23)
CO2: 26 mmol/L (ref 22–32)
Calcium: 8.9 mg/dL (ref 8.9–10.3)
Chloride: 104 mmol/L (ref 98–111)
Creatinine: 0.83 mg/dL (ref 0.61–1.24)
GFR, Estimated: >60 mL/min (ref >60)
Glucose, Bld: 233 mg/dL — ABNORMAL HIGH (ref 70–99)
Potassium: 3.6 mmol/L (ref 3.5–5.1)
Sodium: 141 mmol/L (ref 135–145)
Total Bilirubin: 0.5 mg/dL (ref 0.3–1.2)
Total Protein: 7.2 g/dL (ref 6.5–8.1)

## 2021-09-08 LAB — CBC WITH DIFFERENTIAL (CANCER CENTER ONLY)
Abs Immature Granulocytes: 0.2 10*3/uL — ABNORMAL HIGH (ref 0.00–0.07)
Basophils Absolute: 0.1 10*3/uL (ref 0.0–0.1)
Basophils Relative: 0 %
Eosinophils Absolute: 0 10*3/uL (ref 0.0–0.5)
Eosinophils Relative: 0 %
HCT: 29.1 % — ABNORMAL LOW (ref 39.0–52.0)
Hemoglobin: 9.5 g/dL — ABNORMAL LOW (ref 13.0–17.0)
Immature Granulocytes: 1 %
Lymphocytes Relative: 1 %
Lymphs Abs: 0.3 10*3/uL — ABNORMAL LOW (ref 0.7–4.0)
MCH: 27.9 pg (ref 26.0–34.0)
MCHC: 32.6 g/dL (ref 30.0–36.0)
MCV: 85.3 fL (ref 80.0–100.0)
Monocytes Absolute: 0.8 10*3/uL (ref 0.1–1.0)
Monocytes Relative: 4 %
Neutro Abs: 20.5 10*3/uL — ABNORMAL HIGH (ref 1.7–7.7)
Neutrophils Relative %: 94 %
Platelet Count: 319 10*3/uL (ref 150–400)
RBC: 3.41 MIL/uL — ABNORMAL LOW (ref 4.22–5.81)
RDW: 18.2 % — ABNORMAL HIGH (ref 11.5–15.5)
WBC Count: 21.9 10*3/uL — ABNORMAL HIGH (ref 4.0–10.5)
nRBC: 0 % (ref 0.0–0.2)

## 2021-09-08 MED ORDER — SODIUM CHLORIDE 0.9 % IV SOLN
10.0000 mg | Freq: Once | INTRAVENOUS | Status: AC
  Administered 2021-09-08: 10 mg via INTRAVENOUS
  Filled 2021-09-08: qty 10

## 2021-09-08 MED ORDER — PALONOSETRON HCL INJECTION 0.25 MG/5ML
0.2500 mg | Freq: Once | INTRAVENOUS | Status: AC
  Administered 2021-09-08: 0.25 mg via INTRAVENOUS
  Filled 2021-09-08: qty 5

## 2021-09-08 MED ORDER — SODIUM CHLORIDE 0.9 % IV SOLN: Freq: Once | INTRAVENOUS | Status: AC

## 2021-09-08 MED ORDER — DIPHENHYDRAMINE HCL 50 MG/ML IJ SOLN
50.0000 mg | Freq: Once | INTRAMUSCULAR | Status: AC
  Administered 2021-09-08: 50 mg via INTRAVENOUS
  Filled 2021-09-08: qty 1

## 2021-09-08 MED ORDER — FAMOTIDINE 20 MG IN NS 100 ML IVPB
20.0000 mg | Freq: Once | INTRAVENOUS | Status: AC
  Administered 2021-09-08: 20 mg via INTRAVENOUS
  Filled 2021-09-08: qty 100

## 2021-09-08 MED ORDER — SODIUM CHLORIDE 0.9 % IV SOLN
145.8000 mg | Freq: Once | INTRAVENOUS | Status: AC
  Administered 2021-09-08: 150 mg via INTRAVENOUS
  Filled 2021-09-08: qty 15

## 2021-09-08 MED ORDER — SODIUM CHLORIDE 0.9 % IV SOLN
45.0000 mg/m2 | Freq: Once | INTRAVENOUS | Status: AC
  Administered 2021-09-08: 78 mg via INTRAVENOUS
  Filled 2021-09-08: qty 13

## 2021-09-08 NOTE — Patient Instructions (Signed)
[facility] ONCOLOGY  Discharge Instructions: Thank you for choosing [facility] to provide your oncology and hematology care.   If you have a lab appointment with the [facility], please go directly to the [facility] and check in at the registration area.   Wear comfortable clothing and clothing appropriate for easy access to any Portacath or PICC line.   We strive to give you quality time with your provider. You may need to reschedule your appointment if you arrive late (15 or more minutes).  Arriving late affects you and other patients whose appointments are after yours.  Also, if you miss three or more appointments without notifying the office, you may be dismissed from the clinic at the provider's discretion.      For prescription refill requests, have your pharmacy contact our office and allow 72 hours for refills to be completed.    Today you received the following chemotherapy and/or immunotherapy agents : Taxol, Carboplatin      To help prevent nausea and vomiting after your treatment, we encourage you to take your nausea medication as directed.  BELOW ARE SYMPTOMS THAT SHOULD BE REPORTED IMMEDIATELY: *FEVER GREATER THAN 100.4 F (38 C) OR HIGHER *CHILLS OR SWEATING *NAUSEA AND VOMITING THAT IS NOT CONTROLLED WITH YOUR NAUSEA MEDICATION *UNUSUAL SHORTNESS OF BREATH *UNUSUAL BRUISING OR BLEEDING *URINARY PROBLEMS (pain or burning when urinating, or frequent urination) *BOWEL PROBLEMS (unusual diarrhea, constipation, pain near the anus) TENDERNESS IN MOUTH AND THROAT WITH OR WITHOUT PRESENCE OF ULCERS (sore throat, sores in mouth, or a toothache) UNUSUAL RASH, SWELLING OR PAIN  UNUSUAL VAGINAL DISCHARGE OR ITCHING   Items with * indicate a potential emergency and should be followed up as soon as possible or go to the Emergency Department if any problems should occur.  Please show the CHEMOTHERAPY ALERT CARD or IMMUNOTHERAPY ALERT CARD at  check-in to the Emergency Department and triage nurse.  Should you have questions after your visit or need to cancel or reschedule your appointment, please contact [facility]  Dept: [phone]  and follow the prompts.  Office hours are 8:00 a.m. to 4:30 p.m. Monday - Friday. Please note that voicemails left after 4:00 p.m. may not be returned until the following business day.  We are closed weekends and major holidays. You have access to a nurse at all times for urgent questions. Please call the main number to the clinic Dept: [phone] and follow the prompts.   For any non-urgent questions, you may also contact your provider using MyChart. We now offer e-Visits for anyone 57 and older to request care online for non-urgent symptoms. For details visit [email or web address].   Also download the MyChart app! Go to the app store, search "MyChart", open the app, select [facility], and log in with your MyChart username and password.  Due to Covid, a mask is required upon entering the hospital/clinic. If you do not have a mask, one will be given to you upon arrival. For doctor visits, patients may have 1 support person aged 62 or older with them. For treatment visits, patients cannot have anyone with them due to current Covid guidelines and our immunocompromised population.

## 2021-09-08 NOTE — Progress Notes (Signed)
Nutrition Follow-up:  Patient receiving concurrent chemoradiation therapy for lung cancer. Patient undergoing last cycle of chemotherapy today. He will complete radiation on 10/14.   Met with patient during infusion. He is sleeping this afternoon. Patient awakens with name call. Patient reports he is tired and dozing off during follow-up. RN reports patient received Benadryl prior to visit. Patient reports he is appetite is good, recalls pork chops, rice, potatoes, grits, sandwiches, and eggs. He is drinking 1-2 Ensure daily. Patient agreeable to drinking Ensure during infusion. RD returned with vanilla Ensure Enlive to find patient asleep. Ensure left on chair table.     Medications: reviewed  Labs: Glucose 233, BUN 7  Anthropometrics: Weight 120.5 lb today increased 2 lbs from 118 lb 6 oz on 9/26 and 117 lb 1.6 oz on 9/12   NUTRITION DIAGNOSIS: Unintended wight loss stable   INTERVENTION:  Encouraged eating high calorie, high protein foods to promote weight gain Continue drinking Ensure Complete/equivalent in between meals - coupons provided Patient agreeable to drinking vanilla Ensure Enlive during infusion today Continue prune juice and milk of magnesia for constipation as needed    MONITORING, EVALUATION, GOAL: Weight trends, intake   NEXT VISIT: None scheduled at this time    

## 2021-09-09 ENCOUNTER — Ambulatory Visit
Admission: RE | Admit: 2021-09-09 | Discharge: 2021-09-09 | Disposition: A | Discharge status: Home / Self Care | Payer: Medicare PPO | Source: Ambulatory Visit | Attending: Radiation Oncology | Admitting: Radiation Oncology

## 2021-09-09 ENCOUNTER — Other Ambulatory Visit: Payer: Self-pay

## 2021-09-09 ENCOUNTER — Ambulatory Visit: Payer: Medicare PPO

## 2021-09-09 DIAGNOSIS — C3412 Malignant neoplasm of upper lobe, left bronchus or lung: Secondary | ICD-10-CM | POA: Diagnosis not present

## 2021-09-09 DIAGNOSIS — E1165 Type 2 diabetes mellitus with hyperglycemia: Secondary | ICD-10-CM | POA: Diagnosis not present

## 2021-09-09 DIAGNOSIS — Z510 Encounter for antineoplastic radiation therapy: Secondary | ICD-10-CM | POA: Diagnosis not present

## 2021-09-09 DIAGNOSIS — Z7984 Long term (current) use of oral hypoglycemic drugs: Secondary | ICD-10-CM | POA: Diagnosis not present

## 2021-09-10 ENCOUNTER — Ambulatory Visit: Payer: Medicare PPO

## 2021-09-10 ENCOUNTER — Ambulatory Visit
Admission: RE | Admit: 2021-09-10 | Discharge: 2021-09-10 | Disposition: A | Discharge status: Home / Self Care | Payer: Medicare PPO | Source: Ambulatory Visit | Attending: Radiation Oncology | Admitting: Radiation Oncology

## 2021-09-10 DIAGNOSIS — Z510 Encounter for antineoplastic radiation therapy: Secondary | ICD-10-CM | POA: Diagnosis not present

## 2021-09-10 DIAGNOSIS — E1165 Type 2 diabetes mellitus with hyperglycemia: Secondary | ICD-10-CM | POA: Diagnosis not present

## 2021-09-10 DIAGNOSIS — C3412 Malignant neoplasm of upper lobe, left bronchus or lung: Secondary | ICD-10-CM | POA: Diagnosis not present

## 2021-09-10 DIAGNOSIS — Z7984 Long term (current) use of oral hypoglycemic drugs: Secondary | ICD-10-CM | POA: Diagnosis not present

## 2021-09-11 ENCOUNTER — Ambulatory Visit
Admission: RE | Admit: 2021-09-11 | Discharge: 2021-09-11 | Disposition: A | Discharge status: Home / Self Care | Payer: Medicare PPO | Source: Ambulatory Visit | Attending: Radiation Oncology | Admitting: Radiation Oncology

## 2021-09-11 ENCOUNTER — Other Ambulatory Visit: Payer: Self-pay

## 2021-09-11 ENCOUNTER — Encounter: Payer: Self-pay | Admitting: Radiation Oncology

## 2021-09-11 DIAGNOSIS — Z510 Encounter for antineoplastic radiation therapy: Secondary | ICD-10-CM | POA: Diagnosis not present

## 2021-09-11 DIAGNOSIS — C3412 Malignant neoplasm of upper lobe, left bronchus or lung: Secondary | ICD-10-CM | POA: Diagnosis not present

## 2021-09-11 DIAGNOSIS — E1165 Type 2 diabetes mellitus with hyperglycemia: Secondary | ICD-10-CM | POA: Diagnosis not present

## 2021-09-11 DIAGNOSIS — Z7984 Long term (current) use of oral hypoglycemic drugs: Secondary | ICD-10-CM | POA: Diagnosis not present

## 2021-09-14 ENCOUNTER — Inpatient Hospital Stay: Payer: Medicare PPO

## 2021-09-14 ENCOUNTER — Encounter: Payer: Medicare PPO | Admitting: Nutrition

## 2021-09-14 ENCOUNTER — Encounter: Payer: Self-pay | Admitting: Internal Medicine

## 2021-09-14 NOTE — Progress Notes (Signed)
                                                                                                                                                             Patient Name: Frank Hart MRN: [id number] DOB: 1946-09-09 Referring Physician: Curt Bears (Profile Not Attached) Date of Service: 09/11/2021 [facility], [place]                                                        End Of Treatment Note  Diagnoses: C34.12-Malignant neoplasm of upper lobe, left bronchus or lung  Cancer Staging:  Stage IIIB-IV, cT4N2M0-1a,  NSCLC of the left upper lobe  Intent: Curative  Radiation Treatment Dates: 07/27/2021 through 09/11/2021 Site Technique Total Dose (Gy) Dose per Fx (Gy) Completed Fx Beam Energies  Lung, Left: Lung_Lt 3D 60/60 2 30/30 6X, 10X  Lung, Left: Lung_Lt_Bst 3D 6/6 2 3/3 6X, 10X   Narrative: The patient tolerated radiation therapy relatively well. He did have simulation to consider treatment of the two small pulmonary nodules outside of the left lung target he received treatment to, however there was too much overlap with his prior therapy that Dr. Lisbeth Renshaw would like to reconvene on the use of SBRT after his first post treatment scan.  Plan: The patient will receive a call in about one month from the radiation oncology department. He will continue follow up with Dr. Julien Nordmann as well.   ________________________________________________    Carola Rhine, [facility]

## 2021-09-18 ENCOUNTER — Telehealth: Payer: Self-pay

## 2021-09-18 ENCOUNTER — Telehealth: Payer: Self-pay | Admitting: Internal Medicine

## 2021-09-18 NOTE — Telephone Encounter (Signed)
Sch per 10/11 , pt aware

## 2021-09-18 NOTE — Telephone Encounter (Signed)
Pt called stating he has an ongoing cough. Pt denies fever or worsening SOB. Pt has not been taking anything for his cough and has been advised he can take Delsym or Robitussin but if they do not work, please let us know. Pt expressed understanding of this information.

## 2021-09-21 ENCOUNTER — Inpatient Hospital Stay: Payer: Medicare PPO

## 2021-09-21 ENCOUNTER — Inpatient Hospital Stay: Payer: Medicare PPO | Admitting: Internal Medicine

## 2021-09-28 ENCOUNTER — Inpatient Hospital Stay: Payer: Medicare PPO

## 2021-09-28 ENCOUNTER — Inpatient Hospital Stay: Payer: Medicare PPO | Admitting: Nutrition

## 2021-10-02 ENCOUNTER — Telehealth: Payer: Self-pay | Admitting: Physician Assistant
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
## 2021-10-02 NOTE — Telephone Encounter (Signed)
I called the patient as I am scheduled to see him on 10/06/2021.  The patient CT scan has not been scheduled at this time.  It appears that the patient has changed his home phone number.  I spoke to his niece who gave me his new phone number and I updated the chart.  I called and spoke to the patient and instructed him to call radiology scheduling to schedule his CT scan.  If he is unable to get his CT scan before his follow-up appointment on Tuesday, he was given our callback number instructed to reschedule his follow-up appointment about 2 days after his CT scan.
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
## 2021-10-05 ENCOUNTER — Ambulatory Visit: Payer: Medicare PPO | Admitting: Physician Assistant

## 2021-10-05 ENCOUNTER — Other Ambulatory Visit: Payer: Medicare PPO

## 2021-10-05 ENCOUNTER — Ambulatory Visit: Payer: Medicare PPO

## 2021-10-05 ENCOUNTER — Encounter: Payer: Medicare PPO | Admitting: Nutrition

## 2021-10-05 ENCOUNTER — Ambulatory Visit (HOSPITAL_COMMUNITY): Payer: Medicare PPO

## 2021-10-06 ENCOUNTER — Inpatient Hospital Stay: Payer: Medicare PPO | Admitting: Physician Assistant

## 2021-10-07 ENCOUNTER — Encounter: Payer: Self-pay | Admitting: Pulmonary Disease

## 2021-10-07 ENCOUNTER — Other Ambulatory Visit: Payer: Self-pay

## 2021-10-07 ENCOUNTER — Ambulatory Visit: Payer: Medicare PPO

## 2021-10-07 ENCOUNTER — Ambulatory Visit (HOSPITAL_COMMUNITY)
Admission: RE | Admit: 2021-10-07 | Discharge: 2021-10-07 | Disposition: A | Discharge status: Home / Self Care | Payer: Medicare PPO | Source: Ambulatory Visit | Attending: Physician Assistant | Admitting: Physician Assistant

## 2021-10-07 ENCOUNTER — Ambulatory Visit (INDEPENDENT_AMBULATORY_CARE_PROVIDER_SITE_OTHER): Payer: Medicare PPO | Admitting: Pulmonary Disease

## 2021-10-07 VITALS — BP 120/70 | HR 85 | Temp 98.0°F | Ht 69.8 in | Wt 118.6 lb

## 2021-10-07 DIAGNOSIS — J432 Centrilobular emphysema: Secondary | ICD-10-CM

## 2021-10-07 DIAGNOSIS — C3490 Malignant neoplasm of unspecified part of unspecified bronchus or lung: Secondary | ICD-10-CM | POA: Diagnosis not present

## 2021-10-07 DIAGNOSIS — C3492 Malignant neoplasm of unspecified part of left bronchus or lung: Secondary | ICD-10-CM

## 2021-10-07 DIAGNOSIS — J439 Emphysema, unspecified: Secondary | ICD-10-CM | POA: Diagnosis not present

## 2021-10-07 DIAGNOSIS — R911 Solitary pulmonary nodule: Secondary | ICD-10-CM | POA: Diagnosis not present

## 2021-10-07 DIAGNOSIS — F17200 Nicotine dependence, unspecified, uncomplicated: Secondary | ICD-10-CM

## 2021-10-07 DIAGNOSIS — J449 Chronic obstructive pulmonary disease, unspecified: Secondary | ICD-10-CM

## 2021-10-07 DIAGNOSIS — I700 Atherosclerosis of aorta: Secondary | ICD-10-CM | POA: Diagnosis not present

## 2021-10-07 LAB — PULMONARY FUNCTION TEST
FEF 25-75 Pre: 0.67 L/sec
FEF2575-%Pred-Pre: 30 %
FEV1-%Pred-Pre: 57 %
FEV1-Pre: 1.56 L
FEV1FVC-%Pred-Pre: 68 %
FEV6-%Pred-Pre: 86 %
FEV6-Pre: 2.98 L
FEV6FVC-%Pred-Pre: 103 %
FVC-%Pred-Pre: 83 %
FVC-Pre: 3.04 L
Pre FEV1/FVC ratio: 51 %
Pre FEV6/FVC Ratio: 98 %

## 2021-10-07 MED ORDER — ALBUTEROL SULFATE HFA 108 (90 BASE) MCG/ACT IN AERS: 2.0000 | INHALATION_SPRAY | Freq: Four times a day (QID) | RESPIRATORY_TRACT | 6 refills | Status: AC | PRN

## 2021-10-07 MED ORDER — TRELEGY ELLIPTA 100-62.5-25 MCG/ACT IN AEPB: 1.0000 | INHALATION_SPRAY | Freq: Every day | RESPIRATORY_TRACT | 5 refills | Status: AC

## 2021-10-07 MED ORDER — IOHEXOL 350 MG/ML SOLN
75.0000 mL | Freq: Once | INTRAVENOUS | Status: AC | PRN
  Administered 2021-10-07: 60 mL via INTRAVENOUS

## 2021-10-07 NOTE — Patient Instructions (Signed)
Thank you for visiting Dr. [name] at [facility]. Today we recommend the following:  Meds ordered this encounter  Medications   Fluticasone-Umeclidin-Vilant (TRELEGY ELLIPTA) 100-62.5-25 MCG/ACT AEPB    Sig: Inhale 1 puff into the lungs daily.    Dispense:  60 each    Refill:  5   albuterol (VENTOLIN HFA) 108 (90 Base) MCG/ACT inhaler    Sig: Inhale 2 puffs into the lungs every 6 (six) hours as needed for wheezing or shortness of breath.    Dispense:  8 g    Refill:  6   Return in about 6 months (around 04/06/2022), or if symptoms worsen or fail to improve, for with Eric Form, NP, or Dr. [name].    Please do your part to reduce the spread of COVID-19.
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
## 2021-10-08 ENCOUNTER — Other Ambulatory Visit: Payer: Self-pay | Admitting: Internal Medicine

## 2021-10-08 ENCOUNTER — Inpatient Hospital Stay: Payer: Medicare PPO | Attending: Physician Assistant | Admitting: Physician Assistant

## 2021-10-08 VITALS — BP 120/83 | HR 64 | Temp 96.8°F | Resp 17 | Ht 69.8 in | Wt 119.1 lb

## 2021-10-08 DIAGNOSIS — C3492 Malignant neoplasm of unspecified part of left bronchus or lung: Secondary | ICD-10-CM | POA: Diagnosis not present

## 2021-10-08 DIAGNOSIS — R591 Generalized enlarged lymph nodes: Secondary | ICD-10-CM | POA: Insufficient documentation

## 2021-10-08 DIAGNOSIS — Z5112 Encounter for antineoplastic immunotherapy: Secondary | ICD-10-CM | POA: Diagnosis not present

## 2021-10-08 DIAGNOSIS — Z7189 Other specified counseling: Secondary | ICD-10-CM | POA: Diagnosis not present

## 2021-10-08 DIAGNOSIS — Z79899 Other long term (current) drug therapy: Secondary | ICD-10-CM | POA: Insufficient documentation

## 2021-10-08 DIAGNOSIS — I10 Essential (primary) hypertension: Secondary | ICD-10-CM | POA: Insufficient documentation

## 2021-10-08 DIAGNOSIS — C3412 Malignant neoplasm of upper lobe, left bronchus or lung: Secondary | ICD-10-CM | POA: Diagnosis not present

## 2021-10-08 NOTE — Progress Notes (Signed)
DISCONTINUE ON PATHWAY REGIMEN - Non-Small Cell Lung     Administer weekly:     Paclitaxel      Carboplatin   **Always confirm dose/schedule in your pharmacy ordering system**  REASON: Continuation Of Treatment PRIOR TREATMENT: [id number]: Carboplatin AUC=2 + Paclitaxel 45 mg/m2 Weekly During Radiation TREATMENT RESPONSE: Partial Response (PR)  START ON PATHWAY REGIMEN - Non-Small Cell Lung     A cycle is every 28 days:     Durvalumab   **Always confirm dose/schedule in your pharmacy ordering system**  Patient Characteristics: Preoperative or Nonsurgical Candidate (Clinical Staging), Stage III - Nonsurgical Candidate (Nonsquamous and Squamous), PS = 0, 1 Therapeutic Status: Preoperative or Nonsurgical Candidate (Clinical Staging) AJCC T Category: cT4 AJCC N Category: cN2 AJCC M Category: cM0 AJCC 8 Stage Grouping: IIIB ECOG Performance Status: 1 Intent of Therapy: Curative Intent, Discussed with Patient

## 2021-10-08 NOTE — Patient Instructions (Addendum)
-  We covered a lot of important information at your appointment today regarding what the treatment plan is moving forward. Here are the the main points that were discussed at your office visit with [place] today:  -The treatment will consist of a new medication. This is not chemotherapy. This new drug is a type of Immunotherapy called Imfinzi (Durvalumab).  -We are planning on starting your treatment next week on 10/15/21  -Your treatment will be given once every 4 weeks. You will receive this treatment every four weeks for a total of 1 year (13 total treatments) unless you experience unacceptable toxicity or if there is evidence on your routine CT scans that the cancer is growing  -We will get a CT scan after every 3 treatments to check on the progress of treatment  Side Effects:  -The adverse effect of the immunotherapy including but not limited to immunotherapy mediated skin rash, diarrhea, inflammation of the lung, kidney, liver, thyroid or other endocrine dysfunction  Follow up:  -We will see you back for a follow up visit in 5 weeks.

## 2021-10-12 ENCOUNTER — Other Ambulatory Visit: Payer: Medicare PPO

## 2021-10-12 ENCOUNTER — Ambulatory Visit
Admission: RE | Admit: 2021-10-12 | Discharge: 2021-10-12 | Disposition: A | Discharge status: Home / Self Care | Payer: Medicare PPO | Source: Ambulatory Visit | Attending: Physician Assistant | Admitting: Physician Assistant

## 2021-10-12 ENCOUNTER — Encounter: Payer: Medicare PPO | Admitting: Nutrition

## 2021-10-12 ENCOUNTER — Ambulatory Visit: Payer: Medicare PPO

## 2021-10-12 ENCOUNTER — Telehealth: Payer: Self-pay | Admitting: Internal Medicine

## 2021-10-12 DIAGNOSIS — C3492 Malignant neoplasm of unspecified part of left bronchus or lung: Secondary | ICD-10-CM

## 2021-10-12 NOTE — Telephone Encounter (Signed)
Sch per 11/10 los, pt aware 

## 2021-10-12 NOTE — Progress Notes (Signed)
  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 During treatment he did very well with radiotherapy and did not have significant desquamation. We will follow up with him due to the two lesions in the lungs that were not treated due to too much overlap from the left lung target. He had a repeat CT on 10/07/21 that showed stability of these two spiculated nodules and improvement in his left dominant lung mass.  Impression/Plan: 1. Stage IIIB-IV, [id number],  NSCLC of the left upper lobe. I was unable to reach the patient but left a voicemail and on the message, I  discussed that we would be happy to continue to follow him as needed and keep an eye on his next CT, but he will also continue to follow up with Dr. Julien Nordmann in medical oncology.      Carola Rhine, PAC

## 2021-10-12 NOTE — Progress Notes (Signed)
The patient called back and states he is doing well and back to eating without dysphagia. He is aware of our plans to proceed with reviewing his next scan in case he needs SBRT to the two other nodules in his lungs.

## 2021-10-15 ENCOUNTER — Other Ambulatory Visit: Payer: Self-pay

## 2021-10-15 ENCOUNTER — Inpatient Hospital Stay: Payer: Medicare PPO

## 2021-10-15 VITALS — BP 129/79 | HR 83 | Temp 98.2°F | Resp 18 | Wt 120.2 lb

## 2021-10-15 DIAGNOSIS — R591 Generalized enlarged lymph nodes: Secondary | ICD-10-CM | POA: Diagnosis not present

## 2021-10-15 DIAGNOSIS — I10 Essential (primary) hypertension: Secondary | ICD-10-CM | POA: Diagnosis not present

## 2021-10-15 DIAGNOSIS — C3492 Malignant neoplasm of unspecified part of left bronchus or lung: Secondary | ICD-10-CM

## 2021-10-15 DIAGNOSIS — C3412 Malignant neoplasm of upper lobe, left bronchus or lung: Secondary | ICD-10-CM | POA: Diagnosis not present

## 2021-10-15 DIAGNOSIS — Z5112 Encounter for antineoplastic immunotherapy: Secondary | ICD-10-CM | POA: Diagnosis not present

## 2021-10-15 DIAGNOSIS — Z79899 Other long term (current) drug therapy: Secondary | ICD-10-CM | POA: Diagnosis not present

## 2021-10-15 LAB — CBC WITH DIFFERENTIAL (CANCER CENTER ONLY)
Abs Immature Granulocytes: 0.15 10*3/uL — ABNORMAL HIGH (ref 0.00–0.07)
Basophils Absolute: 0.1 10*3/uL (ref 0.0–0.1)
Basophils Relative: 0 %
Eosinophils Absolute: 0 10*3/uL (ref 0.0–0.5)
Eosinophils Relative: 0 %
HCT: 36 % — ABNORMAL LOW (ref 39.0–52.0)
Hemoglobin: 11.3 g/dL — ABNORMAL LOW (ref 13.0–17.0)
Immature Granulocytes: 1 %
Lymphocytes Relative: 3 %
Lymphs Abs: 0.6 10*3/uL — ABNORMAL LOW (ref 0.7–4.0)
MCH: 27.7 pg (ref 26.0–34.0)
MCHC: 31.4 g/dL (ref 30.0–36.0)
MCV: 88.2 fL (ref 80.0–100.0)
Monocytes Absolute: 1.1 10*3/uL — ABNORMAL HIGH (ref 0.1–1.0)
Monocytes Relative: 5 %
Neutro Abs: 20.4 10*3/uL — ABNORMAL HIGH (ref 1.7–7.7)
Neutrophils Relative %: 91 %
Platelet Count: 341 10*3/uL (ref 150–400)
RBC: 4.08 MIL/uL — ABNORMAL LOW (ref 4.22–5.81)
RDW: 17.1 % — ABNORMAL HIGH (ref 11.5–15.5)
WBC Count: 22.3 10*3/uL — ABNORMAL HIGH (ref 4.0–10.5)
nRBC: 0 % (ref 0.0–0.2)

## 2021-10-15 LAB — CMP (CANCER CENTER ONLY)
ALT: 18 U/L (ref 0–44)
AST: 14 U/L — ABNORMAL LOW (ref 15–41)
Albumin: 3.4 g/dL — ABNORMAL LOW (ref 3.5–5.0)
Alkaline Phosphatase: 120 U/L (ref 38–126)
Anion gap: 7 (ref 5–15)
BUN: 11 mg/dL (ref 8–23)
CO2: 27 mmol/L (ref 22–32)
Calcium: 8.7 mg/dL — ABNORMAL LOW (ref 8.9–10.3)
Chloride: 100 mmol/L (ref 98–111)
Creatinine: 0.8 mg/dL (ref 0.61–1.24)
GFR, Estimated: >60 mL/min (ref >60)
Glucose, Bld: 161 mg/dL — ABNORMAL HIGH (ref 70–99)
Potassium: 3.6 mmol/L (ref 3.5–5.1)
Sodium: 134 mmol/L — ABNORMAL LOW (ref 135–145)
Total Bilirubin: 0.5 mg/dL (ref 0.3–1.2)
Total Protein: 7.6 g/dL (ref 6.5–8.1)

## 2021-10-15 LAB — TSH: TSH: 3.014 u[IU]/mL (ref 0.350–4.500)

## 2021-10-15 MED ORDER — SODIUM CHLORIDE 0.9 % IV SOLN
Freq: Once | INTRAVENOUS | Status: AC
Start: 2021-10-15 — End: 2021-10-15

## 2021-10-15 MED ORDER — SODIUM CHLORIDE 0.9 % IV SOLN
1500.0000 mg | Freq: Once | INTRAVENOUS | Status: AC
  Administered 2021-10-15: 12:00:00 1500 mg via INTRAVENOUS
  Filled 2021-10-15: qty 30

## 2021-10-15 NOTE — Progress Notes (Signed)
Pharmacist Chemotherapy Monitoring - Initial Assessment    Anticipated start date: 10/15/21- [facility] patient   The following has been reviewed per standard work regarding the patient's treatment regimen: The patient's diagnosis, treatment plan and drug doses, and organ/hematologic function Lab orders and baseline tests specific to treatment regimen  The treatment plan start date, drug sequencing, and pre-medications Prior authorization status  Patient's documented medication list, including drug-drug interaction screen and prescriptions for anti-emetics and supportive care specific to the treatment regimen The drug concentrations, fluid compatibility, administration routes, and timing of the medications to be used The patient's access for treatment and lifetime cumulative dose history, if applicable  The patient's medication allergies and previous infusion related reactions, if applicable   Changes made to treatment plan:  N/A  Follow up needed:  N/A   Frank Hart, [facility], 10/15/2021  11:56 AM

## 2021-10-15 NOTE — Progress Notes (Signed)
Patient presents for treatment. RN assessment completed along with the following:  Labs/vitals reviewed - Yes, and within treatment parameters.   Weight within 10% of previous measurement - Yes Oncology Treatment Attestation completed for current therapy- Yes, on date 10/08/21 Informed consent completed and reflects current therapy/intent - Yes, on date 10/15/21             Provider progress note reviewed - Patient not seen by provider today. Most recent note dated 10/08/21 reviewed. Treatment/Antibody/Supportive plan reviewed - Yes, and there are no adjustments needed for today's treatment. S&H and other orders reviewed - Yes, and there are no additional orders identified. Previous treatment date reviewed - Yes, and pt receiving first treatment today.   Patient to proceed with treatment.

## 2021-10-15 NOTE — Patient Instructions (Signed)
[facility]  Discharge Instructions: Thank you for choosing [facility] to provide your oncology and hematology care.   If you have a lab appointment with the [facility], please go directly to the [facility] and check in at the registration area.   Wear comfortable clothing and clothing appropriate for easy access to any Portacath or PICC line.   We strive to give you quality time with your provider. You may need to reschedule your appointment if you arrive late (15 or more minutes).  Arriving late affects you and other patients whose appointments are after yours.  Also, if you miss three or more appointments without notifying the office, you may be dismissed from the clinic at the provider's discretion.      For prescription refill requests, have your pharmacy contact our office and allow 72 hours for refills to be completed.    Today you received the following chemotherapy and/or immunotherapy agents: durvalumab      To help prevent nausea and vomiting after your treatment, we encourage you to take your nausea medication as directed.  BELOW ARE SYMPTOMS THAT SHOULD BE REPORTED IMMEDIATELY: *FEVER GREATER THAN 100.4 F (38 C) OR HIGHER *CHILLS OR SWEATING *NAUSEA AND VOMITING THAT IS NOT CONTROLLED WITH YOUR NAUSEA MEDICATION *UNUSUAL SHORTNESS OF BREATH *UNUSUAL BRUISING OR BLEEDING *URINARY PROBLEMS (pain or burning when urinating, or frequent urination) *BOWEL PROBLEMS (unusual diarrhea, constipation, pain near the anus) TENDERNESS IN MOUTH AND THROAT WITH OR WITHOUT PRESENCE OF ULCERS (sore throat, sores in mouth, or a toothache) UNUSUAL RASH, SWELLING OR PAIN  UNUSUAL VAGINAL DISCHARGE OR ITCHING   Items with * indicate a potential emergency and should be followed up as soon as possible or go to the Emergency Department if any problems should occur.  Please show the CHEMOTHERAPY ALERT CARD or IMMUNOTHERAPY ALERT CARD at check-in to  the Emergency Department and triage nurse.  Should you have questions after your visit or need to cancel or reschedule your appointment, please contact [facility]  Dept: [phone]  and follow the prompts.  Office hours are 8:00 a.m. to 4:30 p.m. Monday - Friday. Please note that voicemails left after 4:00 p.m. may not be returned until the following business day.  We are closed weekends and major holidays. You have access to a nurse at all times for urgent questions. Please call the main number to the clinic Dept: [phone] and follow the prompts.   For any non-urgent questions, you may also contact your provider using MyChart. We now offer e-Visits for anyone 69 and older to request care online for non-urgent symptoms. For details visit [email or web address].   Also download the MyChart app! Go to the app store, search "MyChart", open the app, select [facility], and log in with your MyChart username and password.  Due to Covid, a mask is required upon entering the hospital/clinic. If you do not have a mask, one will be given to you upon arrival. For doctor visits, patients may have 1 support person aged 11 or older with them. For treatment visits, patients cannot have anyone with them due to current Covid guidelines and our immunocompromised population.   Durvalumab injection What is this medication? DURVALUMAB (dur VAL ue mab) is a monoclonal antibody. It is used to treat lung cancer. This medicine may be used for other purposes; ask your health care provider or pharmacist if you have questions. COMMON BRAND NAME(S): IMFINZI What should I tell  my care team before I take this medication? They need to know if you have any of these conditions: autoimmune diseases like Crohn's disease, ulcerative colitis, or lupus have had or planning to have an allogeneic stem cell transplant (uses someone else's stem cells) history of organ transplant history of radiation to  the chest nervous system problems like myasthenia gravis or Guillain-Barre syndrome an unusual or allergic reaction to durvalumab, other medicines, foods, dyes, or preservatives pregnant or trying to get pregnant breast-feeding How should I use this medication? This medicine is for infusion into a vein. It is given by a health care professional in a hospital or clinic setting. A special MedGuide will be given to you before each treatment. Be sure to read this information carefully each time. Talk to your pediatrician regarding the use of this medicine in children. Special care may be needed. Overdosage: If you think you have taken too much of this medicine contact a poison control center or emergency room at once. NOTE: This medicine is only for you. Do not share this medicine with others. What if I miss a dose? It is important not to miss your dose. Call your doctor or health care professional if you are unable to keep an appointment. What may interact with this medication? Interactions have not been studied. This list may not describe all possible interactions. Give your health care provider a list of all the medicines, herbs, non-prescription drugs, or dietary supplements you use. Also tell them if you smoke, drink alcohol, or use illegal drugs. Some items may interact with your medicine. What should I watch for while using this medication? This medication may make you feel generally unwell. Continue your course of treatment even though you feel ill unless your care team tells you to stop. You may need blood work done while you are taking this medication. Do not become pregnant while taking this medication or for 3 months after stopping it. Women should inform their care team if they wish to become pregnant or think they might be pregnant. There is a potential for serious side effects to an unborn child. Talk to your care team or pharmacist for more information. Do not breast-feed an infant  while taking this medication or for 3 months after stopping it. What side effects may I notice from receiving this medication? Side effects that you should report to your care team as soon as possible: Allergic reactions--skin rash, itching, hives, swelling of the face, lips, tongue, or throat Bloody or watery diarrhea Dizziness, loss of balance or coordination, confusion or trouble speaking Dry cough, shortness of breath or trouble breathing Flushing, mostly over the face, neck, and chest, during injection High blood sugar (hyperglycemia)--increased thirst or amount of urine, unusual weakness or fatigue, blurry vision High thyroid levels (hyperthyroidism)--fast or irregular heartbeat, weight loss, excessive sweating or sensitivity to heat, tremors or shaking, anxiety, nervousness, irregular menstrual cycle or spotting Infection--fever, chills, cough, or sore throat Liver injury--right upper belly pain, loss of appetite, nausea, light-colored stool, dark yellow or brown urine, yellowing skin or eyes, unusual weakness or fatigue Low adrenal gland function--nausea, vomiting, loss of appetite, unusual weakness or fatigue, dizziness, low blood pressure Low thyroid levels (hypothyroidism)--unusual weakness or fatigue, increased sensitivity to cold, constipation, hair loss, dry skin, weight gain, feelings of depression Pancreatitis--severe stomach pain that spreads to your back or gets worse after eating or when touched, fever, nausea, vomiting Rash, fever, and swollen lymph nodes Redness, blistering, peeling or loosening of the skin, including  inside the mouth Wheezing--trouble breathing with loud or whistling sounds Side effects that usually do not require medical attention (report these to your care team if they continue or are bothersome): Fatigue Hair loss This list may not describe all possible side effects. Call your doctor for medical advice about side effects. You may report side effects to  FDA at 1-800-FDA-1088. Where should I keep my medication? This medication is given in a hospital or clinic. It will not be stored at home. NOTE: This sheet is a summary. It may not cover all possible information. If you have questions about this medicine, talk to your doctor, pharmacist, or health care provider.  2022 [employer]/Gold Standard (2021-08-04 00:00:00)

## 2021-10-19 ENCOUNTER — Ambulatory Visit: Payer: Medicare PPO

## 2021-10-19 ENCOUNTER — Ambulatory Visit: Payer: Medicare PPO | Admitting: Internal Medicine

## 2021-10-19 ENCOUNTER — Other Ambulatory Visit: Payer: Medicare PPO

## 2021-10-19 ENCOUNTER — Encounter: Payer: Medicare PPO | Admitting: Nutrition

## 2021-10-26 ENCOUNTER — Ambulatory Visit: Payer: Medicare PPO

## 2021-10-26 ENCOUNTER — Other Ambulatory Visit: Payer: Medicare PPO

## 2021-11-02 ENCOUNTER — Ambulatory Visit: Payer: Medicare PPO | Admitting: Physician Assistant

## 2021-11-02 ENCOUNTER — Other Ambulatory Visit: Payer: Medicare PPO

## 2021-11-02 ENCOUNTER — Ambulatory Visit: Payer: Medicare PPO

## 2021-11-09 ENCOUNTER — Other Ambulatory Visit: Payer: Medicare PPO

## 2021-11-09 ENCOUNTER — Ambulatory Visit: Payer: Medicare PPO

## 2021-11-12 ENCOUNTER — Inpatient Hospital Stay: Payer: Medicare PPO | Attending: Physician Assistant

## 2021-11-12 ENCOUNTER — Inpatient Hospital Stay: Payer: Medicare PPO | Admitting: Internal Medicine

## 2021-11-12 ENCOUNTER — Inpatient Hospital Stay: Payer: Medicare PPO

## 2021-12-30 ENCOUNTER — Other Ambulatory Visit: Payer: Self-pay | Admitting: Student

## 2022-01-22 ENCOUNTER — Telehealth: Payer: Self-pay | Admitting: Physician Assistant

## 2022-01-22 NOTE — Telephone Encounter (Signed)
Called patient to schedule appointment per 2/24 , patient is aware of date and time.
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
## 2022-01-28 ENCOUNTER — Encounter: Payer: Self-pay | Admitting: Physician Assistant

## 2022-01-28 ENCOUNTER — Inpatient Hospital Stay (HOSPITAL_BASED_OUTPATIENT_CLINIC_OR_DEPARTMENT_OTHER): Payer: Medicare PPO | Admitting: Physician Assistant

## 2022-01-28 ENCOUNTER — Inpatient Hospital Stay: Payer: Medicare PPO | Attending: Physician Assistant

## 2022-01-28 ENCOUNTER — Other Ambulatory Visit: Payer: Self-pay

## 2022-01-28 ENCOUNTER — Inpatient Hospital Stay: Payer: Medicare PPO

## 2022-01-28 VITALS — BP 116/68 | HR 90 | Temp 97.9°F | Wt 131.1 lb

## 2022-01-28 DIAGNOSIS — Z5112 Encounter for antineoplastic immunotherapy: Secondary | ICD-10-CM | POA: Insufficient documentation

## 2022-01-28 DIAGNOSIS — C3492 Malignant neoplasm of unspecified part of left bronchus or lung: Secondary | ICD-10-CM

## 2022-01-28 DIAGNOSIS — Z79899 Other long term (current) drug therapy: Secondary | ICD-10-CM | POA: Insufficient documentation

## 2022-01-28 DIAGNOSIS — C3412 Malignant neoplasm of upper lobe, left bronchus or lung: Secondary | ICD-10-CM | POA: Diagnosis not present

## 2022-01-28 LAB — CBC WITH DIFFERENTIAL (CANCER CENTER ONLY)
Abs Immature Granulocytes: 0.06 10*3/uL (ref 0.00–0.07)
Basophils Absolute: 0.1 10*3/uL (ref 0.0–0.1)
Basophils Relative: 0 %
Eosinophils Absolute: 0.1 10*3/uL (ref 0.0–0.5)
Eosinophils Relative: 1 %
HCT: 35 % — ABNORMAL LOW (ref 39.0–52.0)
Hemoglobin: 11.3 g/dL — ABNORMAL LOW (ref 13.0–17.0)
Immature Granulocytes: 0 %
Lymphocytes Relative: 4 %
Lymphs Abs: 0.6 10*3/uL — ABNORMAL LOW (ref 0.7–4.0)
MCH: 26.7 pg (ref 26.0–34.0)
MCHC: 32.3 g/dL (ref 30.0–36.0)
MCV: 82.7 fL (ref 80.0–100.0)
Monocytes Absolute: 1.1 10*3/uL — ABNORMAL HIGH (ref 0.1–1.0)
Monocytes Relative: 8 %
Neutro Abs: 13.1 10*3/uL — ABNORMAL HIGH (ref 1.7–7.7)
Neutrophils Relative %: 87 %
Platelet Count: 250 10*3/uL (ref 150–400)
RBC: 4.23 MIL/uL (ref 4.22–5.81)
RDW: 12.9 % (ref 11.5–15.5)
WBC Count: 15 10*3/uL — ABNORMAL HIGH (ref 4.0–10.5)
nRBC: 0 % (ref 0.0–0.2)

## 2022-01-28 LAB — CMP (CANCER CENTER ONLY)
ALT: 5 U/L (ref 0–44)
AST: 7 U/L — ABNORMAL LOW (ref 15–41)
Albumin: 3.5 g/dL (ref 3.5–5.0)
Alkaline Phosphatase: 105 U/L (ref 38–126)
Anion gap: 8 (ref 5–15)
BUN: 13 mg/dL (ref 8–23)
CO2: 24 mmol/L (ref 22–32)
Calcium: 8.8 mg/dL — ABNORMAL LOW (ref 8.9–10.3)
Chloride: 108 mmol/L (ref 98–111)
Creatinine: 0.97 mg/dL (ref 0.61–1.24)
GFR, Estimated: >60 mL/min (ref >60)
Glucose, Bld: 133 mg/dL — ABNORMAL HIGH (ref 70–99)
Potassium: 3.8 mmol/L (ref 3.5–5.1)
Sodium: 140 mmol/L (ref 135–145)
Total Bilirubin: 0.6 mg/dL (ref 0.3–1.2)
Total Protein: 7.4 g/dL (ref 6.5–8.1)

## 2022-01-28 LAB — TSH: TSH: 1.275 u[IU]/mL (ref 0.320–4.118)

## 2022-01-28 MED ORDER — SODIUM CHLORIDE 0.9 % IV SOLN
1500.0000 mg | Freq: Once | INTRAVENOUS | Status: AC
  Administered 2022-01-28: 1500 mg via INTRAVENOUS
  Filled 2022-01-28: qty 30

## 2022-01-28 MED ORDER — SODIUM CHLORIDE 0.9 % IV SOLN: Freq: Once | INTRAVENOUS | Status: AC

## 2022-01-28 NOTE — Patient Instructions (Signed)
[facility]  Discharge Instructions: Thank you for choosing [facility] to provide your oncology and hematology care.   If you have a lab appointment with the [facility], please go directly to the [facility] and check in at the registration area.   Wear comfortable clothing and clothing appropriate for easy access to any Portacath or PICC line.   We strive to give you quality time with your provider. You may need to reschedule your appointment if you arrive late (15 or more minutes).  Arriving late affects you and other patients whose appointments are after yours.  Also, if you miss three or more appointments without notifying the office, you may be dismissed from the clinic at the providers discretion.      For prescription refill requests, have your pharmacy contact our office and allow 72 hours for refills to be completed.    Today you received the following chemotherapy and/or immunotherapy agents: durvalumab      To help prevent nausea and vomiting after your treatment, we encourage you to take your nausea medication as directed.  BELOW ARE SYMPTOMS THAT SHOULD BE REPORTED IMMEDIATELY: *FEVER GREATER THAN 100.4 F (38 C) OR HIGHER *CHILLS OR SWEATING *NAUSEA AND VOMITING THAT IS NOT CONTROLLED WITH YOUR NAUSEA MEDICATION *UNUSUAL SHORTNESS OF BREATH *UNUSUAL BRUISING OR BLEEDING *URINARY PROBLEMS (pain or burning when urinating, or frequent urination) *BOWEL PROBLEMS (unusual diarrhea, constipation, pain near the anus) TENDERNESS IN MOUTH AND THROAT WITH OR WITHOUT PRESENCE OF ULCERS (sore throat, sores in mouth, or a toothache) UNUSUAL RASH, SWELLING OR PAIN  UNUSUAL VAGINAL DISCHARGE OR ITCHING   Items with * indicate a potential emergency and should be followed up as soon as possible or go to the Emergency Department if any problems should occur.  Please show the CHEMOTHERAPY ALERT CARD or IMMUNOTHERAPY ALERT CARD at check-in to  the Emergency Department and triage nurse.  Should you have questions after your visit or need to cancel or reschedule your appointment, please contact [facility]  Dept: [phone]  and follow the prompts.  Office hours are 8:00 a.m. to 4:30 p.m. Monday - Friday. Please note that voicemails left after 4:00 p.m. may not be returned until the following business day.  We are closed weekends and major holidays. You have access to a nurse at all times for urgent questions. Please call the main number to the clinic Dept: [phone] and follow the prompts.   For any non-urgent questions, you may also contact your provider using MyChart. We now offer e-Visits for anyone 34 and older to request care online for non-urgent symptoms. For details visit [email or web address].   Also download the MyChart app! Go to the app store, search "MyChart", open the app, select [name], and log in with your MyChart username and password.  Due to Covid, a mask is required upon entering the hospital/clinic. If you do not have a mask, one will be given to you upon arrival. For doctor visits, patients may have 1 support Frank Hart aged 77 or older with them. For treatment visits, patients cannot have anyone with them due to current Covid guidelines and our immunocompromised population.   Durvalumab injection What is this medication? DURVALUMAB (dur VAL ue mab) is a monoclonal antibody. It is used to treat lung cancer. This medicine may be used for other purposes; ask your health care provider or pharmacist if you have questions. COMMON BRAND NAME(S): IMFINZI What should I tell  my care team before I take this medication? They need to know if you have any of these conditions: autoimmune diseases like Crohn's disease, ulcerative colitis, or lupus have had or planning to have an allogeneic stem cell transplant (uses someone else's stem cells) history of organ transplant history of radiation  to the chest nervous system problems like myasthenia gravis or Guillain-Barre syndrome an unusual or allergic reaction to durvalumab, other medicines, foods, dyes, or preservatives pregnant or trying to get pregnant breast-feeding How should I use this medication? This medicine is for infusion into a vein. It is given by a health care professional in a hospital or clinic setting. A special MedGuide will be given to you before each treatment. Be sure to read this information carefully each time. Talk to your pediatrician regarding the use of this medicine in children. Special care may be needed. Overdosage: If you think you have taken too much of this medicine contact a poison control center or emergency room at once. NOTE: This medicine is only for you. Do not share this medicine with others. What if I miss a dose? It is important not to miss your dose. Call your doctor or health care professional if you are unable to keep an appointment. What may interact with this medication? Interactions have not been studied. This list may not describe all possible interactions. Give your health care provider a list of all the medicines, herbs, non-prescription drugs, or dietary supplements you use. Also tell them if you smoke, drink alcohol, or use illegal drugs. Some items may interact with your medicine. What should I watch for while using this medication? This medication may make you feel generally unwell. Continue your course of treatment even though you feel ill unless your care team tells you to stop. You may need blood work done while you are taking this medication. Do not become pregnant while taking this medication or for 3 months after stopping it. Women should inform their care team if they wish to become pregnant or think they might be pregnant. There is a potential for serious side effects to an unborn child. Talk to your care team or pharmacist for more information. Do not breast-feed an infant  while taking this medication or for 3 months after stopping it. What side effects may I notice from receiving this medication? Side effects that you should report to your care team as soon as possible: Allergic reactions--skin rash, itching, hives, swelling of the face, lips, tongue, or throat Bloody or watery diarrhea Dizziness, loss of balance or coordination, confusion or trouble speaking Dry cough, shortness of breath or trouble breathing Flushing, mostly over the face, neck, and chest, during injection High blood sugar (hyperglycemia)--increased thirst or amount of urine, unusual weakness or fatigue, blurry vision High thyroid levels (hyperthyroidism)--fast or irregular heartbeat, weight loss, excessive sweating or sensitivity to heat, tremors or shaking, anxiety, nervousness, irregular menstrual cycle or spotting Infection--fever, chills, cough, or sore throat Liver injury--right upper belly pain, loss of appetite, nausea, light-colored stool, dark yellow or brown urine, yellowing skin or eyes, unusual weakness or fatigue Low adrenal gland function--nausea, vomiting, loss of appetite, unusual weakness or fatigue, dizziness, low blood pressure Low thyroid levels (hypothyroidism)--unusual weakness or fatigue, increased sensitivity to cold, constipation, hair loss, dry skin, weight gain, feelings of depression Pancreatitis--severe stomach pain that spreads to your back or gets worse after eating or when touched, fever, nausea, vomiting Rash, fever, and swollen lymph nodes Redness, blistering, peeling or loosening of the skin, including  inside the mouth Wheezing--trouble breathing with loud or whistling sounds Side effects that usually do not require medical attention (report these to your care team if they continue or are bothersome): Fatigue Hair loss This list may not describe all possible side effects. Call your doctor for medical advice about side effects. You may report side effects to  FDA at 1-800-FDA-1088. Where should I keep my medication? This medication is given in a hospital or clinic. It will not be stored at home. NOTE: This sheet is a summary. It may not cover all possible information. If you have questions about this medicine, talk to your doctor, pharmacist, or health care provider.  2022 Elsevier/Gold Standard (2021-08-04 00:00:00)

## 2022-01-30 DIAGNOSIS — Z5112 Encounter for antineoplastic immunotherapy: Secondary | ICD-10-CM | POA: Insufficient documentation

## 2022-01-30 DIAGNOSIS — Z5111 Encounter for antineoplastic chemotherapy: Secondary | ICD-10-CM | POA: Insufficient documentation

## 2022-02-11 ENCOUNTER — Ambulatory Visit (INDEPENDENT_AMBULATORY_CARE_PROVIDER_SITE_OTHER): Payer: Medicare PPO

## 2022-02-11 ENCOUNTER — Encounter (HOSPITAL_COMMUNITY): Payer: Self-pay | Admitting: Emergency Medicine

## 2022-02-11 ENCOUNTER — Ambulatory Visit (HOSPITAL_COMMUNITY)
Admission: EM | Admit: 2022-02-11 | Discharge: 2022-02-11 | Disposition: A | Discharge status: Home / Self Care | Payer: Medicare PPO | Attending: Nurse Practitioner | Admitting: Nurse Practitioner

## 2022-02-11 ENCOUNTER — Other Ambulatory Visit: Payer: Self-pay

## 2022-02-11 DIAGNOSIS — L03115 Cellulitis of right lower limb: Secondary | ICD-10-CM

## 2022-02-11 DIAGNOSIS — M79671 Pain in right foot: Secondary | ICD-10-CM

## 2022-02-11 DIAGNOSIS — M25571 Pain in right ankle and joints of right foot: Secondary | ICD-10-CM

## 2022-02-11 MED ORDER — CEFUROXIME AXETIL 500 MG PO TABS: 500.0000 mg | ORAL_TABLET | Freq: Two times a day (BID) | ORAL | 0 refills | Status: AC
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
## 2022-02-11 NOTE — ED Triage Notes (Signed)
Pt reports right foot pain and swelling x 3 weeks. States the pain and swelling has gotten worse. Denies any obvious injuries.  ?

## 2022-02-11 NOTE — Discharge Instructions (Addendum)
-   Please take the entire course of antibiotics for the skin infection in your foot ?- Please make sure you are drinking plenty of fluids ?- If your symptoms do not improve with the treatment or if they worsen, please seek care ?

## 2022-02-17 ENCOUNTER — Ambulatory Visit (INDEPENDENT_AMBULATORY_CARE_PROVIDER_SITE_OTHER): Payer: Medicare PPO

## 2022-02-17 ENCOUNTER — Ambulatory Visit (INDEPENDENT_AMBULATORY_CARE_PROVIDER_SITE_OTHER): Payer: Medicare PPO | Admitting: Podiatry

## 2022-02-17 ENCOUNTER — Other Ambulatory Visit: Payer: Self-pay

## 2022-02-17 DIAGNOSIS — B351 Tinea unguium: Secondary | ICD-10-CM | POA: Diagnosis not present

## 2022-02-17 DIAGNOSIS — M79674 Pain in right toe(s): Secondary | ICD-10-CM | POA: Diagnosis not present

## 2022-02-17 DIAGNOSIS — G629 Polyneuropathy, unspecified: Secondary | ICD-10-CM

## 2022-02-17 DIAGNOSIS — E1142 Type 2 diabetes mellitus with diabetic polyneuropathy: Secondary | ICD-10-CM

## 2022-02-17 DIAGNOSIS — M79675 Pain in left toe(s): Secondary | ICD-10-CM

## 2022-02-17 DIAGNOSIS — R0989 Other specified symptoms and signs involving the circulatory and respiratory systems: Secondary | ICD-10-CM

## 2022-02-17 NOTE — Progress Notes (Signed)
?  Subjective:  ?Patient ID: Frank Hart, male    DOB: 07/06/1946,   MRN: [id number] ? ?Chief Complaint  ?Patient presents with  ? Nail Problem  ?  RFC Bilateral nail trim 1-5.   ? Bunions  ?  Bilateral great bunion pain. Aching pains.   ? Foot Pain  ?  Bilateral heel pain x 3 weeks.   ? Toe Pain  ?  Right great toe bruising, edema and soreness x 3 weeks.   ? ? ?76 y.o. male presents for concern of foot and toe pain. Relates his rgith great toe has been bruised and swollen for 3 weeks. Alsorelate bilateral heel aching pain that makes it diffcult to walk. Relates bilateral achy painful bunion. He is also complaining of painful thickened and elongated toenails that are difficult to trim. Requesting nail trim. He is diabetic and his last A1c was 9.8  . Denies any other pedal complaints. Denies n/v/f/c.  ? ?PCP: [name] DO ? ?Past Medical History:  ?Diagnosis Date  ? CAD (coronary artery disease)   ? cath 10/13/2015 1v dx DES to prox to mid LCx, EF 40-45% by echo  ? Hyperlipidemia   ? Hypertension   ? Ischemic cardiomyopathy   ? EF 40-45% on echo 10/13/2015 after NSTEMI  ? Prediabetes   ? A1C 6.2 on 10/12/2015  ? Tobacco abuse 10/14/2015  ? ? ?Objective:  ?Vascular: Non palpable right DP/PT pulses. 2/4 PT on left. And 0/4 DP. Marland Kitchen CFT <3 seconds. Absent hair growth on digits. Edema noted to bilateral lower extremities. Xerosis noted bilaterally.  ?Skin. No lacerations or abrasions bilateral feet. Nails 1-5 bilateral  are thickened discolored and elongated with subungual debris.  ?Musculoskeletal: MMT 5/5 bilateral lower extremities in DF, PF, Inversion and Eversion. Deceased ROM in DF of ankle joint.  ?Neurological: Sensation intact to light touch. Protective sensation diminished bilateral.  ? ? ?Assessment:  ? ?1. Pain due to onychomycosis of toenails of both feet   ?2. Type 2 diabetes mellitus with diabetic polyneuropathy, unspecified whether long term insulin use ([place])   ?3. Diminished pulses in lower extremity    ? ? ? ?Plan:  ?Patient was evaluated and treated and all questions answered. ?-Discussed and educated patient on diabetic foot care, especially with  ?regards to the vascular, neurological and musculoskeletal systems.  ?-Stressed the importance of good glycemic control and the detriment of not  ?controlling glucose levels in relation to the foot. ?-Discussed supportive shoes at all times and checking feet regularly.  ?-Mechanically debrided all nails 1-5 bilateral using sterile nail nipper and filed with dremel without incident  ?-Discussed following with PCP for gabapentin for neuropathy ?- ABIs ordered to evaluate vascular status.  ?-Answered all patient questions ?-Patient to return  in 3 months for at risk foot care ?-Patient advised to call the office if any problems or questions arise in the meantime. ? ? ?Lorenda Peck, DPM  ? ? ?

## 2022-02-18 ENCOUNTER — Encounter (HOSPITAL_COMMUNITY): Payer: Self-pay

## 2022-02-18 ENCOUNTER — Ambulatory Visit (HOSPITAL_COMMUNITY)
Admission: RE | Admit: 2022-02-18 | Discharge: 2022-02-18 | Disposition: A | Discharge status: Home / Self Care | Payer: Medicare PPO | Source: Ambulatory Visit | Attending: Physician Assistant | Admitting: Physician Assistant

## 2022-02-18 DIAGNOSIS — C3492 Malignant neoplasm of unspecified part of left bronchus or lung: Secondary | ICD-10-CM | POA: Insufficient documentation

## 2022-02-18 MED ORDER — IOHEXOL 300 MG/ML  SOLN
75.0000 mL | Freq: Once | INTRAMUSCULAR | Status: AC | PRN
  Administered 2022-02-18: 75 mL via INTRAVENOUS

## 2022-02-18 MED ORDER — SODIUM CHLORIDE (PF) 0.9 % IJ SOLN
INTRAMUSCULAR | Status: AC
  Filled 2022-02-18: qty 50

## 2022-02-19 ENCOUNTER — Other Ambulatory Visit: Payer: Self-pay | Admitting: Podiatry

## 2022-02-19 DIAGNOSIS — G629 Polyneuropathy, unspecified: Secondary | ICD-10-CM

## 2022-02-24 ENCOUNTER — Ambulatory Visit (HOSPITAL_COMMUNITY)
Admission: RE | Admit: 2022-02-24 | Discharge: 2022-02-24 | Disposition: A | Discharge status: Home / Self Care | Payer: Medicare PPO | Source: Ambulatory Visit | Attending: Podiatry | Admitting: Podiatry

## 2022-02-24 ENCOUNTER — Other Ambulatory Visit: Payer: Self-pay

## 2022-02-24 DIAGNOSIS — R0989 Other specified symptoms and signs involving the circulatory and respiratory systems: Secondary | ICD-10-CM | POA: Insufficient documentation

## 2022-02-24 DIAGNOSIS — M79674 Pain in right toe(s): Secondary | ICD-10-CM | POA: Insufficient documentation

## 2022-02-24 DIAGNOSIS — B351 Tinea unguium: Secondary | ICD-10-CM | POA: Diagnosis not present

## 2022-02-24 DIAGNOSIS — M79675 Pain in left toe(s): Secondary | ICD-10-CM | POA: Diagnosis not present

## 2022-02-24 DIAGNOSIS — E1142 Type 2 diabetes mellitus with diabetic polyneuropathy: Secondary | ICD-10-CM | POA: Diagnosis not present

## 2022-02-25 ENCOUNTER — Ambulatory Visit: Payer: Medicare PPO | Admitting: Internal Medicine

## 2022-02-25 ENCOUNTER — Telehealth: Payer: Self-pay

## 2022-02-25 ENCOUNTER — Other Ambulatory Visit: Payer: Medicare PPO

## 2022-02-25 ENCOUNTER — Ambulatory Visit: Payer: Medicare PPO

## 2022-02-25 NOTE — Telephone Encounter (Signed)
Pt no showed his appts today 02/25/22. I spoke with pts wife and she states the pt thought his appt was at [facility]. Pt has been NS for these appts and scheduling is working to r/s the pt for next week for lab/OV/infusion. ?
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
## 2022-03-25 ENCOUNTER — Encounter: Payer: Self-pay | Admitting: Physician Assistant

## 2022-03-25 ENCOUNTER — Telehealth: Payer: Self-pay

## 2022-03-25 ENCOUNTER — Inpatient Hospital Stay (HOSPITAL_BASED_OUTPATIENT_CLINIC_OR_DEPARTMENT_OTHER): Payer: Medicare PPO | Admitting: Physician Assistant

## 2022-03-25 ENCOUNTER — Inpatient Hospital Stay: Payer: Medicare PPO | Attending: Physician Assistant

## 2022-03-25 ENCOUNTER — Other Ambulatory Visit: Payer: Self-pay

## 2022-03-25 ENCOUNTER — Ambulatory Visit: Payer: Medicare PPO

## 2022-03-25 VITALS — HR 77 | Temp 97.7°F | Resp 16 | Ht 69.8 in | Wt 127.5 lb

## 2022-03-25 DIAGNOSIS — C3492 Malignant neoplasm of unspecified part of left bronchus or lung: Secondary | ICD-10-CM

## 2022-03-25 DIAGNOSIS — Z7189 Other specified counseling: Secondary | ICD-10-CM | POA: Diagnosis not present

## 2022-03-25 DIAGNOSIS — I10 Essential (primary) hypertension: Secondary | ICD-10-CM | POA: Diagnosis not present

## 2022-03-25 DIAGNOSIS — C3412 Malignant neoplasm of upper lobe, left bronchus or lung: Secondary | ICD-10-CM | POA: Insufficient documentation

## 2022-03-25 LAB — CMP (CANCER CENTER ONLY)
ALT: 9 U/L (ref 0–44)
AST: 11 U/L — ABNORMAL LOW (ref 15–41)
Albumin: 3.4 g/dL — ABNORMAL LOW (ref 3.5–5.0)
Alkaline Phosphatase: 122 U/L (ref 38–126)
Anion gap: 5 (ref 5–15)
BUN: 11 mg/dL (ref 8–23)
CO2: 26 mmol/L (ref 22–32)
Calcium: 8.5 mg/dL — ABNORMAL LOW (ref 8.9–10.3)
Chloride: 108 mmol/L (ref 98–111)
Creatinine: 0.88 mg/dL (ref 0.61–1.24)
GFR, Estimated: >60 mL/min (ref >60)
Glucose, Bld: 226 mg/dL — ABNORMAL HIGH (ref 70–99)
Potassium: 3.6 mmol/L (ref 3.5–5.1)
Sodium: 139 mmol/L (ref 135–145)
Total Bilirubin: 0.5 mg/dL (ref 0.3–1.2)
Total Protein: 7.1 g/dL (ref 6.5–8.1)

## 2022-03-25 LAB — CBC WITH DIFFERENTIAL (CANCER CENTER ONLY)
Abs Immature Granulocytes: 0.23 10*3/uL — ABNORMAL HIGH (ref 0.00–0.07)
Basophils Absolute: 0.1 10*3/uL (ref 0.0–0.1)
Basophils Relative: 0 %
Eosinophils Absolute: 0.1 10*3/uL (ref 0.0–0.5)
Eosinophils Relative: 0 %
HCT: 36.2 % — ABNORMAL LOW (ref 39.0–52.0)
Hemoglobin: 11.4 g/dL — ABNORMAL LOW (ref 13.0–17.0)
Immature Granulocytes: 1 %
Lymphocytes Relative: 2 %
Lymphs Abs: 0.7 10*3/uL (ref 0.7–4.0)
MCH: 25.9 pg — ABNORMAL LOW (ref 26.0–34.0)
MCHC: 31.5 g/dL (ref 30.0–36.0)
MCV: 82.1 fL (ref 80.0–100.0)
Monocytes Absolute: 1.5 10*3/uL — ABNORMAL HIGH (ref 0.1–1.0)
Monocytes Relative: 5 %
Neutro Abs: 25.3 10*3/uL — ABNORMAL HIGH (ref 1.7–7.7)
Neutrophils Relative %: 92 %
Platelet Count: 297 10*3/uL (ref 150–400)
RBC: 4.41 MIL/uL (ref 4.22–5.81)
RDW: 15 % (ref 11.5–15.5)
WBC Count: 27.8 10*3/uL — ABNORMAL HIGH (ref 4.0–10.5)
nRBC: 0 % (ref 0.0–0.2)

## 2022-03-25 LAB — TSH: TSH: 1.284 u[IU]/mL (ref 0.350–4.500)

## 2022-03-25 NOTE — Telephone Encounter (Signed)
Pt stated no one from his PCP office has contacted him to follow-up about his right foot cellulitis infection and he is "never able to get anyone on the phone". Pts PCP is at [facility], [employer] location. ? ?I've called his PCPs office and spoke with Renita who is going to try to work the pt into their schedule for tomorrow 03/26/22. I have confirmed with pts SO that she is the contact. We also confirmed her number and she is aware to expect a call for his PCP with his appt time. ?

## 2022-03-26 DIAGNOSIS — M109 Gout, unspecified: Secondary | ICD-10-CM | POA: Diagnosis not present

## 2022-03-26 DIAGNOSIS — Z789 Other specified health status: Secondary | ICD-10-CM | POA: Diagnosis not present

## 2022-03-26 DIAGNOSIS — Z79899 Other long term (current) drug therapy: Secondary | ICD-10-CM | POA: Diagnosis not present

## 2022-03-26 DIAGNOSIS — Z23 Encounter for immunization: Secondary | ICD-10-CM | POA: Diagnosis not present

## 2022-03-26 DIAGNOSIS — F1490 Cocaine use, unspecified, uncomplicated: Secondary | ICD-10-CM | POA: Diagnosis not present

## 2022-03-26 DIAGNOSIS — E1151 Type 2 diabetes mellitus with diabetic peripheral angiopathy without gangrene: Secondary | ICD-10-CM | POA: Diagnosis not present

## 2022-03-26 DIAGNOSIS — Z0001 Encounter for general adult medical examination with abnormal findings: Secondary | ICD-10-CM | POA: Diagnosis not present

## 2022-03-26 DIAGNOSIS — F17210 Nicotine dependence, cigarettes, uncomplicated: Secondary | ICD-10-CM | POA: Diagnosis not present

## 2022-03-26 DIAGNOSIS — E785 Hyperlipidemia, unspecified: Secondary | ICD-10-CM | POA: Diagnosis not present

## 2022-04-02 DIAGNOSIS — H18413 Arcus senilis, bilateral: Secondary | ICD-10-CM | POA: Diagnosis not present

## 2022-04-02 DIAGNOSIS — F1490 Cocaine use, unspecified, uncomplicated: Secondary | ICD-10-CM | POA: Diagnosis not present

## 2022-04-02 DIAGNOSIS — J449 Chronic obstructive pulmonary disease, unspecified: Secondary | ICD-10-CM | POA: Diagnosis not present

## 2022-04-02 DIAGNOSIS — E46 Unspecified protein-calorie malnutrition: Secondary | ICD-10-CM | POA: Diagnosis not present

## 2022-04-02 DIAGNOSIS — I509 Heart failure, unspecified: Secondary | ICD-10-CM | POA: Diagnosis not present

## 2022-04-02 DIAGNOSIS — C7801 Secondary malignant neoplasm of right lung: Secondary | ICD-10-CM | POA: Diagnosis not present

## 2022-04-02 DIAGNOSIS — C3490 Malignant neoplasm of unspecified part of unspecified bronchus or lung: Secondary | ICD-10-CM | POA: Diagnosis not present

## 2022-04-02 DIAGNOSIS — F1290 Cannabis use, unspecified, uncomplicated: Secondary | ICD-10-CM | POA: Diagnosis not present

## 2022-04-02 DIAGNOSIS — Z79899 Other long term (current) drug therapy: Secondary | ICD-10-CM | POA: Diagnosis not present

## 2022-04-08 ENCOUNTER — Telehealth: Payer: Self-pay

## 2022-04-08 NOTE — Telephone Encounter (Signed)
Fax received from Dr. Valentino Nose office (PCP) requesting recommendations for elevated WBC. Note indicates it is unclear if WBCs are due to chemo tx. Review of the fax found no WBC result included. ? ?I have called [facility] and left a message with Precious requesting additional information regarding WBC count. I have also advised pt is not currently under tx due to his non-compliance. Precious advised she will contact Dr. Valentina Gu for additional information. ?

## 2022-04-09 ENCOUNTER — Telehealth: Payer: Self-pay | Admitting: Medical Oncology

## 2022-04-09 DIAGNOSIS — C3490 Malignant neoplasm of unspecified part of unspecified bronchus or lung: Secondary | ICD-10-CM

## 2022-04-09 NOTE — Telephone Encounter (Signed)
PCP f/u- ? ?Dr Valentina Gu is requesting recommendations for ordering scans , antibiotics . Social worker involved to assist pts with appts ," if he will participate'.  ? ?I told Maudie Mercury that his CT scan of the chest is reordered . Maudie Mercury  will f/u with pt for the appt and to have social worker help him with transportation. ? ?She said Dr. Valentina Gu will take care of ordering antibiotics for the cellulitis. ?

## 2022-04-14 ENCOUNTER — Ambulatory Visit (HOSPITAL_COMMUNITY)
Admission: RE | Admit: 2022-04-14 | Discharge: 2022-04-14 | Disposition: A | Discharge status: Home / Self Care | Payer: Medicare PPO | Source: Ambulatory Visit | Attending: Physician Assistant | Admitting: Physician Assistant

## 2022-04-14 DIAGNOSIS — R918 Other nonspecific abnormal finding of lung field: Secondary | ICD-10-CM | POA: Diagnosis not present

## 2022-04-14 DIAGNOSIS — J439 Emphysema, unspecified: Secondary | ICD-10-CM | POA: Diagnosis not present

## 2022-04-14 DIAGNOSIS — I700 Atherosclerosis of aorta: Secondary | ICD-10-CM | POA: Diagnosis not present

## 2022-04-14 DIAGNOSIS — C3492 Malignant neoplasm of unspecified part of left bronchus or lung: Secondary | ICD-10-CM | POA: Insufficient documentation

## 2022-04-14 MED ORDER — IOHEXOL 300 MG/ML  SOLN
75.0000 mL | Freq: Once | INTRAMUSCULAR | Status: AC | PRN
  Administered 2022-04-14: 75 mL via INTRAVENOUS

## 2022-04-15 ENCOUNTER — Telehealth: Payer: Self-pay | Admitting: Physician Assistant

## 2022-04-15 ENCOUNTER — Telehealth: Payer: Self-pay

## 2022-04-15 NOTE — Telephone Encounter (Signed)
I called the patient and was unable to reach him.  I left a voicemail requesting that he please call us back so we can review the results of his CT scan which was performed yesterday.  I wanted to let the patient know that there are some areas in the lung that have appeared to enlarge.  Because of this, we would like to see him in person to discuss what the next steps are.  I have also sent a scheduling message to make him a follow-up appointment next week to discuss this with Dr. Julien Nordmann or myself in more detail.  Call back number left.  We will try to call again later.

## 2022-04-15 NOTE — Telephone Encounter (Signed)
Attempted to reach pt. There was no answer and voicemail is full.   I have also called his niece, Levada Dy, and left a detailed message requesting a call back to schedule a follow-up appt with Cassandra, PA-C to review his CT results.

## 2022-04-15 NOTE — Telephone Encounter (Signed)
.  Called pt per 5/18 inbasket , Patient was unavailable, a message with appt time and date was left with number on file.

## 2022-04-15 NOTE — Telephone Encounter (Signed)
I called the patient to review his CT scan that was performed yesterday.  The scan did show some interval enlargement.  Therefore, I discussed with the patient that we would like to see him back in the clinic next week to review these in more detail and to discuss what the next steps are.  Discussed with the patient that he has an appointment next week on 04/20/2022 with the arrival time of 2:40 PM.  He confirmed this on the phone with me.  We will see him next week.
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
## 2022-04-20 ENCOUNTER — Inpatient Hospital Stay: Payer: Medicare PPO | Attending: Physician Assistant | Admitting: Physician Assistant

## 2022-04-20 ENCOUNTER — Other Ambulatory Visit: Payer: Self-pay | Admitting: Internal Medicine

## 2022-04-20 VITALS — BP 118/85 | HR 113 | Temp 98.1°F | Resp 18 | Wt 122.7 lb

## 2022-04-20 DIAGNOSIS — I255 Ischemic cardiomyopathy: Secondary | ICD-10-CM | POA: Diagnosis not present

## 2022-04-20 DIAGNOSIS — C3412 Malignant neoplasm of upper lobe, left bronchus or lung: Secondary | ICD-10-CM | POA: Diagnosis not present

## 2022-04-20 DIAGNOSIS — Z7189 Other specified counseling: Secondary | ICD-10-CM | POA: Diagnosis not present

## 2022-04-20 DIAGNOSIS — R7303 Prediabetes: Secondary | ICD-10-CM | POA: Insufficient documentation

## 2022-04-20 DIAGNOSIS — I10 Essential (primary) hypertension: Secondary | ICD-10-CM | POA: Insufficient documentation

## 2022-04-20 DIAGNOSIS — I2510 Atherosclerotic heart disease of native coronary artery without angina pectoris: Secondary | ICD-10-CM | POA: Insufficient documentation

## 2022-04-20 DIAGNOSIS — C3492 Malignant neoplasm of unspecified part of left bronchus or lung: Secondary | ICD-10-CM | POA: Diagnosis not present

## 2022-04-20 MED ORDER — PROCHLORPERAZINE MALEATE 10 MG PO TABS: 10.0000 mg | ORAL_TABLET | Freq: Four times a day (QID) | ORAL | 2 refills | Status: AC | PRN

## 2022-04-20 NOTE — Progress Notes (Signed)
DISCONTINUE ON PATHWAY REGIMEN - Non-Small Cell Lung     A cycle is every 28 days:     Durvalumab   **Always confirm dose/schedule in your pharmacy ordering system**  REASON: Disease Progression PRIOR TREATMENT: [id number]: Durvalumab 1,500 mg q28 Days x up to 12 Months TREATMENT RESPONSE: Progressive Disease (PD)  START ON PATHWAY REGIMEN - Non-Small Cell Lung     A cycle is every 21 days:     Pembrolizumab      Paclitaxel      Carboplatin   **Always confirm dose/schedule in your pharmacy ordering system**  Patient Characteristics: Stage IV Metastatic, Squamous, Molecular Analysis Not Elected, PS = 0, 1, Initial Chemotherapy/Immunotherapy, Immunotherapy Candidate, PD-L1 Expression Positive 1-49% (TPS) / Negative / Not Tested / Awaiting Test Results Therapeutic Status: Stage IV Metastatic Histology: Squamous Cell ECOG Performance Status: 1 Chemotherapy/Immunotherapy Line of Therapy: Initial Chemotherapy/Immunotherapy Immunotherapy Candidate Status: Candidate for Immunotherapy PD-L1 Expression Status: PD-L1 Positive 1-49% (TPS) Intent of Therapy: Non-Curative / Palliative Intent, Discussed with Patient

## 2022-04-20 NOTE — Patient Instructions (Addendum)
-  There are two main categories of lung cancer, they are named based on the size of the cancer cell. One is called Non-Small cell lung cancer. The other type is Small Cell Lung Cancer -The sample (biopsy) that they took of your tumor was consistent with a subtype of Non-small cell lung cancer called Squamous Cell Carcinoma.  -We covered a lot of important information at your appointment today regarding what the treatment plan is moving forward. Here are the the main points that were discussed at your office visit with [place] today:  -The treatment that you will receive consists of two chemotherapy drugs, called Carboplatin and Paclitaxel (also referred to as Taxol) and one immunotherapy drug called Keytruda (pembrolizumab).  -We are planning on starting your treatment next week on 04/28/22 -Your treatment will be given once every 3 weeks. We will check your labs once a week for the first ~5 treatments just to make sure that important components of your blood are in an acceptable range -You will need to return 2 days after your treatment to receive an injection. This injection is important because it boosts your infection fighting cells in your body and helps protect you from getting an infection.  -We will get a CT scan after 3 treatments to check on the progress of treatment.   Medications:  -Compazine was sent to your pharmacy. This medication is for nausea. You may take this every 6 hours as needed if you feel nausous.   Referrals or Imaging:   Follow up:  -We will see you back for a follow up visit in about  2 weeks to see how your first treatment went and to make sure you are not having any side effects from treatment.   If you need to contact our office, please do not hesitate, we are here to help. Our number is [phone]. When you call, ask to speak to Cassie's or Dr. Worthy Flank nurse.

## 2022-04-22 ENCOUNTER — Ambulatory Visit: Payer: Medicare PPO | Admitting: Internal Medicine

## 2022-04-22 ENCOUNTER — Other Ambulatory Visit: Payer: Medicare PPO

## 2022-04-22 ENCOUNTER — Ambulatory Visit: Payer: Medicare PPO

## 2022-04-23 ENCOUNTER — Telehealth: Payer: Self-pay | Admitting: Physician Assistant

## 2022-04-23 NOTE — Telephone Encounter (Signed)
Scheduled per 06/23 los, patient has been called and notified of upcoming appointments and informed patient the correct locations to go to.

## 2022-04-28 ENCOUNTER — Other Ambulatory Visit: Payer: Self-pay | Admitting: Medical Oncology

## 2022-04-28 DIAGNOSIS — C3490 Malignant neoplasm of unspecified part of unspecified bronchus or lung: Secondary | ICD-10-CM

## 2022-04-29 ENCOUNTER — Inpatient Hospital Stay: Payer: Medicare PPO

## 2022-04-29 ENCOUNTER — Inpatient Hospital Stay: Payer: Medicare PPO | Attending: Physician Assistant

## 2022-04-29 ENCOUNTER — Other Ambulatory Visit: Payer: Self-pay | Admitting: Internal Medicine

## 2022-04-29 VITALS — BP 112/91 | HR 96 | Temp 98.2°F | Resp 17 | Ht 69.8 in | Wt 122.6 lb

## 2022-04-29 DIAGNOSIS — Z5112 Encounter for antineoplastic immunotherapy: Secondary | ICD-10-CM | POA: Diagnosis not present

## 2022-04-29 DIAGNOSIS — C7802 Secondary malignant neoplasm of left lung: Secondary | ICD-10-CM | POA: Diagnosis not present

## 2022-04-29 DIAGNOSIS — C3412 Malignant neoplasm of upper lobe, left bronchus or lung: Secondary | ICD-10-CM | POA: Diagnosis not present

## 2022-04-29 DIAGNOSIS — Z79899 Other long term (current) drug therapy: Secondary | ICD-10-CM | POA: Insufficient documentation

## 2022-04-29 DIAGNOSIS — Z5189 Encounter for other specified aftercare: Secondary | ICD-10-CM | POA: Diagnosis not present

## 2022-04-29 DIAGNOSIS — C7801 Secondary malignant neoplasm of right lung: Secondary | ICD-10-CM | POA: Diagnosis not present

## 2022-04-29 DIAGNOSIS — C3490 Malignant neoplasm of unspecified part of unspecified bronchus or lung: Secondary | ICD-10-CM

## 2022-04-29 DIAGNOSIS — C3492 Malignant neoplasm of unspecified part of left bronchus or lung: Secondary | ICD-10-CM

## 2022-04-29 DIAGNOSIS — Z5111 Encounter for antineoplastic chemotherapy: Secondary | ICD-10-CM | POA: Insufficient documentation

## 2022-04-29 LAB — CBC
HCT: 35.2 % — ABNORMAL LOW (ref 39.0–52.0)
Hemoglobin: 11.1 g/dL — ABNORMAL LOW (ref 13.0–17.0)
MCH: 25.3 pg — ABNORMAL LOW (ref 26.0–34.0)
MCHC: 31.5 g/dL (ref 30.0–36.0)
MCV: 80.2 fL (ref 80.0–100.0)
Platelets: 321 10*3/uL (ref 150–400)
RBC: 4.39 MIL/uL (ref 4.22–5.81)
RDW: 15.4 % (ref 11.5–15.5)
WBC: 36.8 10*3/uL — ABNORMAL HIGH (ref 4.0–10.5)
nRBC: 0 % (ref 0.0–0.2)

## 2022-04-29 LAB — CMP (CANCER CENTER ONLY)
ALT: 6 U/L (ref 0–44)
AST: 10 U/L — ABNORMAL LOW (ref 15–41)
Albumin: 3.5 g/dL (ref 3.5–5.0)
Alkaline Phosphatase: 117 U/L (ref 38–126)
Anion gap: 11 (ref 5–15)
BUN: 11 mg/dL (ref 8–23)
CO2: 24 mmol/L (ref 22–32)
Calcium: 9.2 mg/dL (ref 8.9–10.3)
Chloride: 104 mmol/L (ref 98–111)
Creatinine: 0.9 mg/dL (ref 0.61–1.24)
GFR, Estimated: >60 mL/min (ref >60)
Glucose, Bld: 150 mg/dL — ABNORMAL HIGH (ref 70–99)
Potassium: 4.1 mmol/L (ref 3.5–5.1)
Sodium: 139 mmol/L (ref 135–145)
Total Bilirubin: 0.9 mg/dL (ref 0.3–1.2)
Total Protein: 7.8 g/dL (ref 6.5–8.1)

## 2022-04-29 LAB — CBC WITH DIFFERENTIAL (CANCER CENTER ONLY)

## 2022-04-29 LAB — DIFFERENTIAL
Abs Immature Granulocytes: 0.34 10*3/uL — ABNORMAL HIGH (ref 0.00–0.07)
Basophils Absolute: 0.1 10*3/uL (ref 0.0–0.1)
Basophils Relative: 0 %
Eosinophils Absolute: 0.1 10*3/uL (ref 0.0–0.5)
Eosinophils Relative: 0 %
Immature Granulocytes: 1 %
Lymphocytes Relative: 3 %
Lymphs Abs: 1 10*3/uL (ref 0.7–4.0)
Monocytes Absolute: 1.7 10*3/uL — ABNORMAL HIGH (ref 0.1–1.0)
Monocytes Relative: 5 %
Neutro Abs: 33.6 10*3/uL — ABNORMAL HIGH (ref 1.7–7.7)
Neutrophils Relative %: 91 %

## 2022-04-29 MED ORDER — PALONOSETRON HCL INJECTION 0.25 MG/5ML
0.2500 mg | Freq: Once | INTRAVENOUS | Status: AC
  Administered 2022-04-29: 0.25 mg via INTRAVENOUS

## 2022-04-29 MED ORDER — SODIUM CHLORIDE 0.9 % IV SOLN
175.0000 mg/m2 | Freq: Once | INTRAVENOUS | Status: AC
  Administered 2022-04-29: 288 mg via INTRAVENOUS
  Filled 2022-04-29: qty 48

## 2022-04-29 MED ORDER — SODIUM CHLORIDE 0.9 % IV SOLN
150.0000 mg | Freq: Once | INTRAVENOUS | Status: AC
  Administered 2022-04-29: 150 mg via INTRAVENOUS
  Filled 2022-04-29: qty 150

## 2022-04-29 MED ORDER — SODIUM CHLORIDE 0.9 % IV SOLN: Freq: Once | INTRAVENOUS | Status: AC

## 2022-04-29 MED ORDER — SODIUM CHLORIDE 0.9 % IV SOLN
372.5000 mg | Freq: Once | INTRAVENOUS | Status: AC
  Administered 2022-04-29: 370 mg via INTRAVENOUS
  Filled 2022-04-29: qty 37

## 2022-04-29 MED ORDER — SODIUM CHLORIDE 0.9 % IV SOLN
10.0000 mg | Freq: Once | INTRAVENOUS | Status: AC
  Administered 2022-04-29: 10 mg via INTRAVENOUS
  Filled 2022-04-29: qty 10

## 2022-04-29 MED ORDER — DIPHENHYDRAMINE HCL 50 MG/ML IJ SOLN
50.0000 mg | Freq: Once | INTRAMUSCULAR | Status: AC
  Administered 2022-04-29: 50 mg via INTRAVENOUS

## 2022-04-29 MED ORDER — SODIUM CHLORIDE 0.9 % IV SOLN
200.0000 mg | Freq: Once | INTRAVENOUS | Status: AC
  Administered 2022-04-29: 200 mg via INTRAVENOUS
  Filled 2022-04-29: qty 8

## 2022-04-29 MED ORDER — FAMOTIDINE IN NACL 20-0.9 MG/50ML-% IV SOLN
20.0000 mg | Freq: Once | INTRAVENOUS | Status: AC
  Administered 2022-04-29: 20 mg via INTRAVENOUS

## 2022-04-29 NOTE — Patient Instructions (Signed)
Frank Hart   Discharge Instructions: Thank you for choosing [facility] to provide your oncology and hematology care.   If you have a lab appointment with the [facility], please go directly to the [facility] and check in at the registration area.   Wear comfortable clothing and clothing appropriate for easy access to any Portacath or PICC line.   We strive to give you quality time with your provider. You may need to reschedule your appointment if you arrive late (15 or more minutes).  Arriving late affects you and other patients whose appointments are after yours.  Also, if you miss three or more appointments without notifying the office, you may be dismissed from the clinic at the provider's discretion.      For prescription refill requests, have your pharmacy contact our office and allow 72 hours for refills to be completed.    Today you received the following chemotherapy and/or immunotherapy agents Keytruda, Taxol, Carboplatin.      To help prevent nausea and vomiting after your treatment, we encourage you to take your nausea medication as directed.  BELOW ARE SYMPTOMS THAT SHOULD BE REPORTED IMMEDIATELY: *FEVER GREATER THAN 100.4 F (38 C) OR HIGHER *CHILLS OR SWEATING *NAUSEA AND VOMITING THAT IS NOT CONTROLLED WITH YOUR NAUSEA MEDICATION *UNUSUAL SHORTNESS OF BREATH *UNUSUAL BRUISING OR BLEEDING *URINARY PROBLEMS (pain or burning when urinating, or frequent urination) *BOWEL PROBLEMS (unusual diarrhea, constipation, pain near the anus) TENDERNESS IN MOUTH AND THROAT WITH OR WITHOUT PRESENCE OF ULCERS (sore throat, sores in mouth, or a toothache) UNUSUAL RASH, SWELLING OR PAIN  UNUSUAL VAGINAL DISCHARGE OR ITCHING   Items with * indicate a potential emergency and should be followed up as soon as possible or go to the Emergency Department if any problems should occur.  Please show the CHEMOTHERAPY ALERT CARD or IMMUNOTHERAPY ALERT  CARD at check-in to the Emergency Department and triage nurse.  Should you have questions after your visit or need to cancel or reschedule your appointment, please contact [facility]  Dept: [phone]  and follow the prompts.  Office hours are 8:00 a.m. to 4:30 p.m. Monday - Friday. Please note that voicemails left after 4:00 p.m. may not be returned until the following business day.  We are closed weekends and major holidays. You have access to a nurse at all times for urgent questions. Please call the main number to the clinic Dept: [phone] and follow the prompts.   For any non-urgent questions, you may also contact your provider using MyChart. We now offer e-Visits for anyone 92 and older to request care online for non-urgent symptoms. For details visit [email or web address].   Also download the MyChart app! Go to the app store, search "MyChart", open the app, select [facility], and log in with your MyChart username and password.  Due to Covid, a mask is required upon entering the hospital/clinic. If you do not have a mask, one will be given to you upon arrival. For doctor visits, patients may have 1 support person aged 44 or older with them. For treatment visits, patients cannot have anyone with them due to current Covid guidelines and our immunocompromised population.   Pembrolizumab injection What is this medication? PEMBROLIZUMAB (pem broe liz ue mab) is a monoclonal antibody. It is used to treat certain types of cancer. This medicine may be used for other purposes; ask your health care provider or pharmacist if you have questions. COMMON BRAND  NAME(S): Keytruda What should I tell my care team before I take this medication? They need to know if you have any of these conditions: autoimmune diseases like Crohn's disease, ulcerative colitis, or lupus have had or planning to have an allogeneic stem cell transplant (uses someone else's stem cells) history  of organ transplant history of chest radiation nervous system problems like myasthenia gravis or Guillain-Barre syndrome an unusual or allergic reaction to pembrolizumab, other medicines, foods, dyes, or preservatives pregnant or trying to get pregnant breast-feeding How should I use this medication? This medicine is for infusion into a vein. It is given by a health care professional in a hospital or clinic setting. A special MedGuide will be given to you before each treatment. Be sure to read this information carefully each time. Talk to your pediatrician regarding the use of this medicine in children. While this drug may be prescribed for children as young as 6 months for selected conditions, precautions do apply. Overdosage: If you think you have taken too much of this medicine contact a poison control center or emergency room at once. NOTE: This medicine is only for you. Do not share this medicine with others. What if I miss a dose? It is important not to miss your dose. Call your doctor or health care professional if you are unable to keep an appointment. What may interact with this medication? Interactions have not been studied. This list may not describe all possible interactions. Give your health care provider a list of all the medicines, herbs, non-prescription drugs, or dietary supplements you use. Also tell them if you smoke, drink alcohol, or use illegal drugs. Some items may interact with your medicine. What should I watch for while using this medication? Your condition will be monitored carefully while you are receiving this medicine. You may need blood work done while you are taking this medicine. Do not become pregnant while taking this medicine or for 4 months after stopping it. Women should inform their doctor if they wish to become pregnant or think they might be pregnant. There is a potential for serious side effects to an unborn child. Talk to your health care professional or  pharmacist for more information. Do not breast-feed an infant while taking this medicine or for 4 months after the last dose. What side effects may I notice from receiving this medication? Side effects that you should report to your doctor or health care professional as soon as possible: allergic reactions like skin rash, itching or hives, swelling of the face, lips, or tongue bloody or black, tarry breathing problems changes in vision chest pain chills confusion constipation cough diarrhea dizziness or feeling faint or lightheaded fast or irregular heartbeat fever flushing joint pain low blood counts - this medicine may decrease the number of white blood cells, red blood cells and platelets. You may be at increased risk for infections and bleeding. muscle pain muscle weakness pain, tingling, numbness in the hands or feet persistent headache redness, blistering, peeling or loosening of the skin, including inside the mouth signs and symptoms of Frank blood sugar such as dizziness; dry mouth; dry skin; fruity breath; nausea; stomach pain; increased hunger or thirst; increased urination signs and symptoms of kidney injury like trouble passing urine or change in the amount of urine signs and symptoms of liver injury like dark urine, light-colored stools, loss of appetite, nausea, right upper belly pain, yellowing of the eyes or skin sweating swollen lymph nodes weight loss Side effects that usually do  not require medical attention (report to your doctor or health care professional if they continue or are bothersome): decreased appetite hair loss tiredness This list may not describe all possible side effects. Call your doctor for medical advice about side effects. You may report side effects to FDA at 1-800-FDA-1088. Where should I keep my medication? This drug is given in a hospital or clinic and will not be stored at home. NOTE: This sheet is a summary. It may not cover all possible  information. If you have questions about this medicine, talk to your doctor, pharmacist, or health care provider.  2023 Elsevier/Gold Standard (2021-10-16 00:00:00)  Paclitaxel injection What is this medication? PACLITAXEL (PAK li TAX el) is a chemotherapy drug. It targets fast dividing cells, like cancer cells, and causes these cells to die. This medicine is used to treat ovarian cancer, breast cancer, lung cancer, Kaposi's sarcoma, and other cancers. This medicine may be used for other purposes; ask your health care provider or pharmacist if you have questions. COMMON BRAND NAME(S): Onxol, Taxol What should I tell my care team before I take this medication? They need to know if you have any of these conditions: history of irregular heartbeat liver disease low blood counts, like low white cell, platelet, or red cell counts lung or breathing disease, like asthma tingling of the fingers or toes, or other nerve disorder an unusual or allergic reaction to paclitaxel, alcohol, polyoxyethylated castor oil, other chemotherapy, other medicines, foods, dyes, or preservatives pregnant or trying to get pregnant breast-feeding How should I use this medication? This drug is given as an infusion into a vein. It is administered in a hospital or clinic by a specially trained health care professional. Talk to your pediatrician regarding the use of this medicine in children. Special care may be needed. Overdosage: If you think you have taken too much of this medicine contact a poison control center or emergency room at once. NOTE: This medicine is only for you. Do not share this medicine with others. What if I miss a dose? It is important not to miss your dose. Call your doctor or health care professional if you are unable to keep an appointment. What may interact with this medication? Do not take this medicine with any of the following medications: live virus vaccines This medicine may also interact  with the following medications: antiviral medicines for hepatitis, HIV or AIDS certain antibiotics like erythromycin and clarithromycin certain medicines for fungal infections like ketoconazole and itraconazole certain medicines for seizures like carbamazepine, phenobarbital, phenytoin gemfibrozil nefazodone rifampin St. John's wort This list may not describe all possible interactions. Give your health care provider a list of all the medicines, herbs, non-prescription drugs, or dietary supplements you use. Also tell them if you smoke, drink alcohol, or use illegal drugs. Some items may interact with your medicine. What should I watch for while using this medication? Your condition will be monitored carefully while you are receiving this medicine. You will need important blood work done while you are taking this medicine. This medicine can cause serious allergic reactions. To reduce your risk you will need to take other medicine(s) before treatment with this medicine. If you experience allergic reactions like skin rash, itching or hives, swelling of the face, lips, or tongue, tell your doctor or health care professional right away. In some cases, you may be given additional medicines to help with side effects. Follow all directions for their use. This drug may make you feel generally unwell. This  is not uncommon, as chemotherapy can affect healthy cells as well as cancer cells. Report any side effects. Continue your course of treatment even though you feel ill unless your doctor tells you to stop. Call your doctor or health care professional for advice if you get a fever, chills or sore throat, or other symptoms of a cold or flu. Do not treat yourself. This drug decreases your body's ability to fight infections. Try to avoid being around people who are sick. This medicine may increase your risk to bruise or bleed. Call your doctor or health care professional if you notice any unusual bleeding. Be  careful brushing and flossing your teeth or using a toothpick because you may get an infection or bleed more easily. If you have any dental work done, tell your dentist you are receiving this medicine. Avoid taking products that contain aspirin, acetaminophen, ibuprofen, naproxen, or ketoprofen unless instructed by your doctor. These medicines may hide a fever. Do not become pregnant while taking this medicine. Women should inform their doctor if they wish to become pregnant or think they might be pregnant. There is a potential for serious side effects to an unborn child. Talk to your health care professional or pharmacist for more information. Do not breast-feed an infant while taking this medicine. Men are advised not to father a child while receiving this medicine. This product may contain alcohol. Ask your pharmacist or healthcare provider if this medicine contains alcohol. Be sure to tell all healthcare providers you are taking this medicine. Certain medicines, like metronidazole and disulfiram, can cause an unpleasant reaction when taken with alcohol. The reaction includes flushing, headache, nausea, vomiting, sweating, and increased thirst. The reaction can last from 30 minutes to several hours. What side effects may I notice from receiving this medication? Side effects that you should report to your doctor or health care professional as soon as possible: allergic reactions like skin rash, itching or hives, swelling of the face, lips, or tongue breathing problems changes in vision fast, irregular heartbeat Frank or low blood pressure mouth sores pain, tingling, numbness in the hands or feet signs of decreased platelets or bleeding - bruising, pinpoint red spots on the skin, black, tarry stools, blood in the urine signs of decreased red blood cells - unusually weak or tired, feeling faint or lightheaded, falls signs of infection - fever or chills, cough, sore throat, pain or difficulty passing  urine signs and symptoms of liver injury like dark yellow or brown urine; general ill feeling or flu-like symptoms; light-colored stools; loss of appetite; nausea; right upper belly pain; unusually weak or tired; yellowing of the eyes or skin swelling of the ankles, feet, hands unusually slow heartbeat Side effects that usually do not require medical attention (report to your doctor or health care professional if they continue or are bothersome): diarrhea hair loss loss of appetite muscle or joint pain nausea, vomiting pain, redness, or irritation at site where injected tiredness This list may not describe all possible side effects. Call your doctor for medical advice about side effects. You may report side effects to FDA at 1-800-FDA-1088. Where should I keep my medication? This drug is given in a hospital or clinic and will not be stored at home. NOTE: This sheet is a summary. It may not cover all possible information. If you have questions about this medicine, talk to your doctor, pharmacist, or health care provider.  2023 Elsevier/Gold Standard (2021-10-16 00:00:00)  Carboplatin injection What is this medication? CARBOPLATIN (KAR  boe pla tin) is a chemotherapy drug. It targets fast dividing cells, like cancer cells, and causes these cells to die. This medicine is used to treat ovarian cancer and many other cancers. This medicine may be used for other purposes; ask your health care provider or pharmacist if you have questions. COMMON BRAND NAME(S): Paraplatin What should I tell my care team before I take this medication? They need to know if you have any of these conditions: blood disorders hearing problems kidney disease recent or ongoing radiation therapy an unusual or allergic reaction to carboplatin, cisplatin, other chemotherapy, other medicines, foods, dyes, or preservatives pregnant or trying to get pregnant breast-feeding How should I use this medication? This drug is  usually given as an infusion into a vein. It is administered in a hospital or clinic by a specially trained health care professional. Talk to your pediatrician regarding the use of this medicine in children. Special care may be needed. Overdosage: If you think you have taken too much of this medicine contact a poison control center or emergency room at once. NOTE: This medicine is only for you. Do not share this medicine with others. What if I miss a dose? It is important not to miss a dose. Call your doctor or health care professional if you are unable to keep an appointment. What may interact with this medication? medicines for seizures medicines to increase blood counts like filgrastim, pegfilgrastim, sargramostim some antibiotics like amikacin, gentamicin, neomycin, streptomycin, tobramycin vaccines Talk to your doctor or health care professional before taking any of these medicines: acetaminophen aspirin ibuprofen ketoprofen naproxen This list may not describe all possible interactions. Give your health care provider a list of all the medicines, herbs, non-prescription drugs, or dietary supplements you use. Also tell them if you smoke, drink alcohol, or use illegal drugs. Some items may interact with your medicine. What should I watch for while using this medication? Your condition will be monitored carefully while you are receiving this medicine. You will need important blood work done while you are taking this medicine. This drug may make you feel generally unwell. This is not uncommon, as chemotherapy can affect healthy cells as well as cancer cells. Report any side effects. Continue your course of treatment even though you feel ill unless your doctor tells you to stop. In some cases, you may be given additional medicines to help with side effects. Follow all directions for their use. Call your doctor or health care professional for advice if you get a fever, chills or sore throat, or  other symptoms of a cold or flu. Do not treat yourself. This drug decreases your body's ability to fight infections. Try to avoid being around people who are sick. This medicine may increase your risk to bruise or bleed. Call your doctor or health care professional if you notice any unusual bleeding. Be careful brushing and flossing your teeth or using a toothpick because you may get an infection or bleed more easily. If you have any dental work done, tell your dentist you are receiving this medicine. Avoid taking products that contain aspirin, acetaminophen, ibuprofen, naproxen, or ketoprofen unless instructed by your doctor. These medicines may hide a fever. Do not become pregnant while taking this medicine. Women should inform their doctor if they wish to become pregnant or think they might be pregnant. There is a potential for serious side effects to an unborn child. Talk to your health care professional or pharmacist for more information. Do not breast-feed an  infant while taking this medicine. What side effects may I notice from receiving this medication? Side effects that you should report to your doctor or health care professional as soon as possible: allergic reactions like skin rash, itching or hives, swelling of the face, lips, or tongue signs of infection - fever or chills, cough, sore throat, pain or difficulty passing urine signs of decreased platelets or bleeding - bruising, pinpoint red spots on the skin, black, tarry stools, nosebleeds signs of decreased red blood cells - unusually weak or tired, fainting spells, lightheadedness breathing problems changes in hearing changes in vision chest pain Frank blood pressure low blood counts - This drug may decrease the number of white blood cells, red blood cells and platelets. You may be at increased risk for infections and bleeding. nausea and vomiting pain, swelling, redness or irritation at the injection site pain, tingling, numbness in  the hands or feet problems with balance, talking, walking trouble passing urine or change in the amount of urine Side effects that usually do not require medical attention (report to your doctor or health care professional if they continue or are bothersome): hair loss loss of appetite metallic taste in the mouth or changes in taste This list may not describe all possible side effects. Call your doctor for medical advice about side effects. You may report side effects to FDA at 1-800-FDA-1088. Where should I keep my medication? This drug is given in a hospital or clinic and will not be stored at home. NOTE: This sheet is a summary. It may not cover all possible information. If you have questions about this medicine, talk to your doctor, pharmacist, or health care provider.  2023 Elsevier/Gold Standard (2008-04-24 00:00:00)

## 2022-04-30 ENCOUNTER — Telehealth: Payer: Self-pay

## 2022-04-30 NOTE — Telephone Encounter (Signed)
-----   Message from Marylu Lund, RN sent at 04/29/2022  2:57 PM EDT ----- Regarding: Dr. Julien Nordmann First Time Chemo Patient received First Time Keytruda / Taxol / Carboplatin today and tolerated these well.

## 2022-04-30 NOTE — Telephone Encounter (Signed)
Frank Hart states that he is doing fine.  He is eating, drinking and urinating well. He was actually outside doing yard work. Encouraged him to get in at least 64 oz of caffeine free fluid, especially working in the heat. Pt verbalized understanding. He knows to call the office at [phone] if he has any questions or concerns.

## 2022-04-30 NOTE — Telephone Encounter (Signed)
Attempted to leave message for patient to call to follow up regarding treatment yesterday.  No answer and voice mail is full.

## 2022-05-01 ENCOUNTER — Inpatient Hospital Stay: Payer: Medicare PPO

## 2022-05-01 VITALS — BP 104/70 | HR 120 | Temp 98.1°F | Resp 18

## 2022-05-01 DIAGNOSIS — Z5111 Encounter for antineoplastic chemotherapy: Secondary | ICD-10-CM | POA: Diagnosis not present

## 2022-05-01 DIAGNOSIS — C3492 Malignant neoplasm of unspecified part of left bronchus or lung: Secondary | ICD-10-CM

## 2022-05-01 DIAGNOSIS — Z5112 Encounter for antineoplastic immunotherapy: Secondary | ICD-10-CM | POA: Diagnosis not present

## 2022-05-01 DIAGNOSIS — C3412 Malignant neoplasm of upper lobe, left bronchus or lung: Secondary | ICD-10-CM | POA: Diagnosis not present

## 2022-05-01 DIAGNOSIS — Z79899 Other long term (current) drug therapy: Secondary | ICD-10-CM | POA: Diagnosis not present

## 2022-05-01 DIAGNOSIS — Z5189 Encounter for other specified aftercare: Secondary | ICD-10-CM | POA: Diagnosis not present

## 2022-05-01 DIAGNOSIS — C7801 Secondary malignant neoplasm of right lung: Secondary | ICD-10-CM | POA: Diagnosis not present

## 2022-05-01 DIAGNOSIS — C7802 Secondary malignant neoplasm of left lung: Secondary | ICD-10-CM | POA: Diagnosis not present

## 2022-05-01 MED ORDER — PEGFILGRASTIM-CBQV 6 MG/0.6ML ~~LOC~~ SOSY
6.0000 mg | PREFILLED_SYRINGE | Freq: Once | SUBCUTANEOUS | Status: AC
  Administered 2022-05-01: 6 mg via SUBCUTANEOUS
  Filled 2022-05-01: qty 0.6

## 2022-05-01 NOTE — Patient Instructions (Signed)

## 2022-05-05 ENCOUNTER — Encounter: Payer: Self-pay | Admitting: Internal Medicine

## 2022-05-05 NOTE — Progress Notes (Signed)
Received call from patient's sgo stating patient needs assistance with utility bill which has been disconnected.  Verified patient information and reviewed treatment plan for available resources. Patient eligible to apply for [employer]. Advised what is needed to apply. She verbalized understanding and will call me back with needed information and an appointment date and time this week to complete grant application process.  She has my card with my contact information to return my call and for any additional financial questions or concerns.

## 2022-05-12 ENCOUNTER — Encounter: Payer: Self-pay | Admitting: Internal Medicine

## 2022-05-12 NOTE — Progress Notes (Unsigned)
Called patient's sgo to follow up on concern from last week. She states he received help from a family member to take care of bill. She states he still wants to apply for grant. Advised what is needed to bring at next visit. She verbalized understanding.  She has my card for any additional financial questions or concerns.
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
## 2022-05-14 ENCOUNTER — Telehealth: Payer: Self-pay | Admitting: Medical Oncology

## 2022-05-14 NOTE — Telephone Encounter (Signed)
Frank Hart called about "missed called" from our office.  I called her back and told her there was no call made to pt from Dr [facility] office. Next appts confirmed.

## 2022-05-17 ENCOUNTER — Other Ambulatory Visit: Payer: Self-pay

## 2022-05-17 DIAGNOSIS — C3492 Malignant neoplasm of unspecified part of left bronchus or lung: Secondary | ICD-10-CM

## 2022-05-17 MED FILL — Dexamethasone Sodium Phosphate Inj 100 MG/10ML: INTRAMUSCULAR | Qty: 1 | Status: AC

## 2022-05-17 MED FILL — Fosaprepitant Dimeglumine For IV Infusion 150 MG (Base Eq): INTRAVENOUS | Qty: 5 | Status: AC

## 2022-05-18 ENCOUNTER — Other Ambulatory Visit: Payer: Self-pay

## 2022-05-18 ENCOUNTER — Inpatient Hospital Stay (HOSPITAL_BASED_OUTPATIENT_CLINIC_OR_DEPARTMENT_OTHER): Payer: Medicare PPO | Admitting: Physician Assistant

## 2022-05-18 ENCOUNTER — Inpatient Hospital Stay: Payer: Medicare PPO

## 2022-05-18 ENCOUNTER — Encounter: Payer: Self-pay | Admitting: Physician Assistant

## 2022-05-18 ENCOUNTER — Other Ambulatory Visit: Payer: Self-pay | Admitting: Physician Assistant

## 2022-05-18 VITALS — BP 102/77 | HR 104 | Temp 97.4°F | Resp 16 | Wt 124.1 lb

## 2022-05-18 DIAGNOSIS — Z5189 Encounter for other specified aftercare: Secondary | ICD-10-CM | POA: Diagnosis not present

## 2022-05-18 DIAGNOSIS — C3492 Malignant neoplasm of unspecified part of left bronchus or lung: Secondary | ICD-10-CM

## 2022-05-18 DIAGNOSIS — Z5111 Encounter for antineoplastic chemotherapy: Secondary | ICD-10-CM | POA: Diagnosis not present

## 2022-05-18 DIAGNOSIS — C3412 Malignant neoplasm of upper lobe, left bronchus or lung: Secondary | ICD-10-CM | POA: Diagnosis not present

## 2022-05-18 DIAGNOSIS — Z5112 Encounter for antineoplastic immunotherapy: Secondary | ICD-10-CM

## 2022-05-18 DIAGNOSIS — C7802 Secondary malignant neoplasm of left lung: Secondary | ICD-10-CM | POA: Diagnosis not present

## 2022-05-18 DIAGNOSIS — Z79899 Other long term (current) drug therapy: Secondary | ICD-10-CM | POA: Diagnosis not present

## 2022-05-18 DIAGNOSIS — C7801 Secondary malignant neoplasm of right lung: Secondary | ICD-10-CM | POA: Diagnosis not present

## 2022-05-18 LAB — CMP (CANCER CENTER ONLY)
ALT: 8 U/L (ref 0–44)
AST: 8 U/L — ABNORMAL LOW (ref 15–41)
Albumin: 3.5 g/dL (ref 3.5–5.0)
Alkaline Phosphatase: 164 U/L — ABNORMAL HIGH (ref 38–126)
Anion gap: 6 (ref 5–15)
BUN: 9 mg/dL (ref 8–23)
CO2: 24 mmol/L (ref 22–32)
Calcium: 8.9 mg/dL (ref 8.9–10.3)
Chloride: 108 mmol/L (ref 98–111)
Creatinine: 0.81 mg/dL (ref 0.61–1.24)
GFR, Estimated: >60 mL/min (ref >60)
Glucose, Bld: 134 mg/dL — ABNORMAL HIGH (ref 70–99)
Potassium: 4.1 mmol/L (ref 3.5–5.1)
Sodium: 138 mmol/L (ref 135–145)
Total Bilirubin: 0.5 mg/dL (ref 0.3–1.2)
Total Protein: 7.2 g/dL (ref 6.5–8.1)

## 2022-05-18 LAB — CBC WITH DIFFERENTIAL (CANCER CENTER ONLY)
Abs Immature Granulocytes: 0.86 10*3/uL — ABNORMAL HIGH (ref 0.00–0.07)
Basophils Absolute: 0.2 10*3/uL — ABNORMAL HIGH (ref 0.0–0.1)
Basophils Relative: 0 %
Eosinophils Absolute: 0.1 10*3/uL (ref 0.0–0.5)
Eosinophils Relative: 0 %
HCT: 35.2 % — ABNORMAL LOW (ref 39.0–52.0)
Hemoglobin: 11.1 g/dL — ABNORMAL LOW (ref 13.0–17.0)
Immature Granulocytes: 2 %
Lymphocytes Relative: 2 %
Lymphs Abs: 1.2 10*3/uL (ref 0.7–4.0)
MCH: 25.9 pg — ABNORMAL LOW (ref 26.0–34.0)
MCHC: 31.5 g/dL (ref 30.0–36.0)
MCV: 82.1 fL (ref 80.0–100.0)
Monocytes Absolute: 1.7 10*3/uL — ABNORMAL HIGH (ref 0.1–1.0)
Monocytes Relative: 3 %
Neutro Abs: 48.4 10*3/uL — ABNORMAL HIGH (ref 1.7–7.7)
Neutrophils Relative %: 93 %
Platelet Count: 289 10*3/uL (ref 150–400)
RBC: 4.29 MIL/uL (ref 4.22–5.81)
RDW: 16.7 % — ABNORMAL HIGH (ref 11.5–15.5)
WBC Count: 52.5 10*3/uL (ref 4.0–10.5)
nRBC: 0 % (ref 0.0–0.2)

## 2022-05-18 LAB — TSH: TSH: 2.004 u[IU]/mL (ref 0.350–4.500)

## 2022-05-18 MED ORDER — FAMOTIDINE IN NACL 20-0.9 MG/50ML-% IV SOLN
20.0000 mg | Freq: Once | INTRAVENOUS | Status: AC
  Administered 2022-05-18: 20 mg via INTRAVENOUS
  Filled 2022-05-18: qty 50

## 2022-05-18 MED ORDER — SODIUM CHLORIDE 0.9 % IV SOLN: Freq: Once | INTRAVENOUS | Status: AC

## 2022-05-18 MED ORDER — PALONOSETRON HCL INJECTION 0.25 MG/5ML
0.2500 mg | Freq: Once | INTRAVENOUS | Status: AC
  Administered 2022-05-18: 0.25 mg via INTRAVENOUS
  Filled 2022-05-18: qty 5

## 2022-05-18 MED ORDER — SODIUM CHLORIDE 0.9 % IV SOLN
175.0000 mg/m2 | Freq: Once | INTRAVENOUS | Status: AC
  Administered 2022-05-18: 288 mg via INTRAVENOUS
  Filled 2022-05-18: qty 48

## 2022-05-18 MED ORDER — SODIUM CHLORIDE 0.9 % IV SOLN
372.5000 mg | Freq: Once | INTRAVENOUS | Status: AC
  Administered 2022-05-18: 370 mg via INTRAVENOUS
  Filled 2022-05-18: qty 37

## 2022-05-18 MED ORDER — SODIUM CHLORIDE 0.9 % IV SOLN
200.0000 mg | Freq: Once | INTRAVENOUS | Status: AC
  Administered 2022-05-18: 200 mg via INTRAVENOUS
  Filled 2022-05-18: qty 200

## 2022-05-18 MED ORDER — LIDOCAINE-PRILOCAINE 2.5-2.5 % EX CREA: 1.0000 | TOPICAL_CREAM | CUTANEOUS | 2 refills | Status: AC | PRN

## 2022-05-18 MED ORDER — SODIUM CHLORIDE 0.9 % IV SOLN
10.0000 mg | Freq: Once | INTRAVENOUS | Status: AC
  Administered 2022-05-18: 10 mg via INTRAVENOUS
  Filled 2022-05-18: qty 10

## 2022-05-18 MED ORDER — SODIUM CHLORIDE 0.9 % IV SOLN
150.0000 mg | Freq: Once | INTRAVENOUS | Status: AC
  Administered 2022-05-18: 150 mg via INTRAVENOUS
  Filled 2022-05-18: qty 150

## 2022-05-18 MED ORDER — DIPHENHYDRAMINE HCL 50 MG/ML IJ SOLN
50.0000 mg | Freq: Once | INTRAMUSCULAR | Status: AC
  Administered 2022-05-18: 50 mg via INTRAVENOUS
  Filled 2022-05-18: qty 1

## 2022-05-18 NOTE — Patient Instructions (Signed)
[facility]COL[facility]  Discharge Instructions: Thank you for choosing [facility] to provide your oncology and hematology care.   If you have a lab appointment with the [facility], please go directly to the [facility] and check in at the registration area.   Wear comfortable clothing and clothing appropriate for easy access to any Portacath or PICC line.   We strive to give you quality time with your provider. You may need to reschedule your appointment if you arrive late (15 or more minutes).  Arriving late affects you and other patients whose appointments are after yours.  Also, if you miss three or more appointments without notifying the office, you may be dismissed from the clinic at the provider's discretion.      For prescription refill requests, have your pharmacy contact our office and allow 72 hours for refills to be completed.    Today you received the following chemotherapy and/or immunotherapy agents: Pembrolizumab (Keytruda), Paclitaxel (Taxol), and Carboplatin.   To help prevent nausea and vomiting after your treatment, we encourage you to take your nausea medication as directed.  BELOW ARE SYMPTOMS THAT SHOULD BE REPORTED IMMEDIATELY: *FEVER GREATER THAN 100.4 F (38 C) OR HIGHER *CHILLS OR SWEATING *NAUSEA AND VOMITING THAT IS NOT CONTROLLED WITH YOUR NAUSEA MEDICATION *UNUSUAL SHORTNESS OF BREATH *UNUSUAL BRUISING OR BLEEDING *URINARY PROBLEMS (pain or burning when urinating, or frequent urination) *BOWEL PROBLEMS (unusual diarrhea, constipation, pain near the anus) TENDERNESS IN MOUTH AND THROAT WITH OR WITHOUT PRESENCE OF ULCERS (sore throat, sores in mouth, or a toothache) UNUSUAL RASH, SWELLING OR PAIN  UNUSUAL VAGINAL DISCHARGE OR ITCHING   Items with * indicate a potential emergency and should be followed up as soon as possible or go to the Emergency Department if any problems should occur.  Please show the CHEMOTHERAPY  ALERT CARD or IMMUNOTHERAPY ALERT CARD at check-in to the Emergency Department and triage nurse.  Should you have questions after your visit or need to cancel or reschedule your appointment, please contact [facility]  Dept: [phone]  and follow the prompts.  Office hours are 8:00 a.m. to 4:30 p.m. Monday - Friday. Please note that voicemails left after 4:00 p.m. may not be returned until the following business day.  We are closed weekends and major holidays. You have access to a nurse at all times for urgent questions. Please call the main number to the clinic Dept: [phone] and follow the prompts.   For any non-urgent questions, you may also contact your provider using MyChart. We now offer e-Visits for anyone 12 and older to request care online for non-urgent symptoms. For details visit [email or web address].   Also download the MyChart app! Go to the app store, search "MyChart", open the app, select [facility], and log in with your MyChart username and password.  Masks are optional in the cancer centers. If you would like for your care team to wear a mask while they are taking care of you, please let them know. For doctor visits, patients may have with them one support person who is at least 77 years old. At this time, visitors are not allowed in the infusion area.

## 2022-05-18 NOTE — Progress Notes (Signed)
CRITICAL VALUE STICKER  CRITICAL VALUE: WBC 52.5  RECEIVER (on-site recipient of call): Barbar Brede, LPN  DATE & TIME NOTIFIED: [phone] 05/18/22  MESSENGER (representative from lab): Hillary  MD NOTIFIED: Vito Backers, PA  TIME OF NOTIFICATION:0839  RESPONSE:  Patient received neulasta.

## 2022-05-19 ENCOUNTER — Ambulatory Visit (INDEPENDENT_AMBULATORY_CARE_PROVIDER_SITE_OTHER): Payer: Self-pay | Admitting: Podiatry

## 2022-05-19 DIAGNOSIS — Z91199 Patient's noncompliance with other medical treatment and regimen due to unspecified reason: Secondary | ICD-10-CM

## 2022-05-19 NOTE — Progress Notes (Signed)
No show

## 2022-05-20 ENCOUNTER — Ambulatory Visit: Payer: Medicare PPO

## 2022-05-21 ENCOUNTER — Encounter: Payer: Self-pay | Admitting: *Deleted

## 2022-05-25 ENCOUNTER — Other Ambulatory Visit: Payer: Self-pay

## 2022-05-25 ENCOUNTER — Inpatient Hospital Stay: Payer: Medicare PPO

## 2022-05-25 DIAGNOSIS — Z5112 Encounter for antineoplastic immunotherapy: Secondary | ICD-10-CM | POA: Diagnosis not present

## 2022-05-25 DIAGNOSIS — C3412 Malignant neoplasm of upper lobe, left bronchus or lung: Secondary | ICD-10-CM | POA: Diagnosis not present

## 2022-05-25 DIAGNOSIS — Z79899 Other long term (current) drug therapy: Secondary | ICD-10-CM | POA: Diagnosis not present

## 2022-05-25 DIAGNOSIS — Z5111 Encounter for antineoplastic chemotherapy: Secondary | ICD-10-CM | POA: Diagnosis not present

## 2022-05-25 DIAGNOSIS — C3492 Malignant neoplasm of unspecified part of left bronchus or lung: Secondary | ICD-10-CM

## 2022-05-25 DIAGNOSIS — Z5189 Encounter for other specified aftercare: Secondary | ICD-10-CM | POA: Diagnosis not present

## 2022-05-25 DIAGNOSIS — C7802 Secondary malignant neoplasm of left lung: Secondary | ICD-10-CM | POA: Diagnosis not present

## 2022-05-25 DIAGNOSIS — C7801 Secondary malignant neoplasm of right lung: Secondary | ICD-10-CM | POA: Diagnosis not present

## 2022-05-25 LAB — CBC WITH DIFFERENTIAL (CANCER CENTER ONLY)
Abs Immature Granulocytes: 0.78 10*3/uL — ABNORMAL HIGH (ref 0.00–0.07)
Basophils Absolute: 0.1 10*3/uL (ref 0.0–0.1)
Basophils Relative: 1 %
Eosinophils Absolute: 0.1 10*3/uL (ref 0.0–0.5)
Eosinophils Relative: 1 %
HCT: 31.9 % — ABNORMAL LOW (ref 39.0–52.0)
Hemoglobin: 10.3 g/dL — ABNORMAL LOW (ref 13.0–17.0)
Immature Granulocytes: 3 %
Lymphocytes Relative: 4 %
Lymphs Abs: 0.9 10*3/uL (ref 0.7–4.0)
MCH: 26.3 pg (ref 26.0–34.0)
MCHC: 32.3 g/dL (ref 30.0–36.0)
MCV: 81.6 fL (ref 80.0–100.0)
Monocytes Absolute: 0.5 10*3/uL (ref 0.1–1.0)
Monocytes Relative: 2 %
Neutro Abs: 22.5 10*3/uL — ABNORMAL HIGH (ref 1.7–7.7)
Neutrophils Relative %: 89 %
Platelet Count: 206 10*3/uL (ref 150–400)
RBC: 3.91 MIL/uL — ABNORMAL LOW (ref 4.22–5.81)
RDW: 16.4 % — ABNORMAL HIGH (ref 11.5–15.5)
WBC Count: 24.9 10*3/uL — ABNORMAL HIGH (ref 4.0–10.5)
nRBC: 0 % (ref 0.0–0.2)

## 2022-05-25 LAB — CMP (CANCER CENTER ONLY)
ALT: 11 U/L (ref 0–44)
AST: 9 U/L — ABNORMAL LOW (ref 15–41)
Albumin: 3.6 g/dL (ref 3.5–5.0)
Alkaline Phosphatase: 143 U/L — ABNORMAL HIGH (ref 38–126)
Anion gap: 7 (ref 5–15)
BUN: 15 mg/dL (ref 8–23)
CO2: 25 mmol/L (ref 22–32)
Calcium: 9 mg/dL (ref 8.9–10.3)
Chloride: 107 mmol/L (ref 98–111)
Creatinine: 0.79 mg/dL (ref 0.61–1.24)
GFR, Estimated: >60 mL/min (ref >60)
Glucose, Bld: 156 mg/dL — ABNORMAL HIGH (ref 70–99)
Potassium: 4 mmol/L (ref 3.5–5.1)
Sodium: 139 mmol/L (ref 135–145)
Total Bilirubin: 0.7 mg/dL (ref 0.3–1.2)
Total Protein: 6.9 g/dL (ref 6.5–8.1)

## 2022-05-26 ENCOUNTER — Other Ambulatory Visit: Payer: Self-pay | Admitting: Radiology

## 2022-05-27 ENCOUNTER — Inpatient Hospital Stay (HOSPITAL_COMMUNITY): Admission: RE | Admit: 2022-05-27 | Payer: Medicare PPO | Source: Ambulatory Visit

## 2022-05-27 ENCOUNTER — Ambulatory Visit (HOSPITAL_COMMUNITY): Payer: Medicare PPO

## 2022-06-02 ENCOUNTER — Inpatient Hospital Stay: Payer: Medicare PPO | Attending: Physician Assistant

## 2022-06-02 ENCOUNTER — Other Ambulatory Visit: Payer: Self-pay

## 2022-06-02 DIAGNOSIS — Z5111 Encounter for antineoplastic chemotherapy: Secondary | ICD-10-CM | POA: Insufficient documentation

## 2022-06-02 DIAGNOSIS — C3492 Malignant neoplasm of unspecified part of left bronchus or lung: Secondary | ICD-10-CM

## 2022-06-02 DIAGNOSIS — Z5112 Encounter for antineoplastic immunotherapy: Secondary | ICD-10-CM | POA: Insufficient documentation

## 2022-06-02 DIAGNOSIS — Z79899 Other long term (current) drug therapy: Secondary | ICD-10-CM | POA: Insufficient documentation

## 2022-06-02 DIAGNOSIS — C3412 Malignant neoplasm of upper lobe, left bronchus or lung: Secondary | ICD-10-CM | POA: Insufficient documentation

## 2022-06-02 LAB — CBC WITH DIFFERENTIAL (CANCER CENTER ONLY)
Abs Immature Granulocytes: 0.28 10*3/uL — ABNORMAL HIGH (ref 0.00–0.07)
Basophils Absolute: 0.1 10*3/uL (ref 0.0–0.1)
Basophils Relative: 0 %
Eosinophils Absolute: 0 10*3/uL (ref 0.0–0.5)
Eosinophils Relative: 0 %
HCT: 30.1 % — ABNORMAL LOW (ref 39.0–52.0)
Hemoglobin: 9.5 g/dL — ABNORMAL LOW (ref 13.0–17.0)
Immature Granulocytes: 1 %
Lymphocytes Relative: 3 %
Lymphs Abs: 0.8 10*3/uL (ref 0.7–4.0)
MCH: 26.2 pg (ref 26.0–34.0)
MCHC: 31.6 g/dL (ref 30.0–36.0)
MCV: 82.9 fL (ref 80.0–100.0)
Monocytes Absolute: 1.4 10*3/uL — ABNORMAL HIGH (ref 0.1–1.0)
Monocytes Relative: 5 %
Neutro Abs: 25.5 10*3/uL — ABNORMAL HIGH (ref 1.7–7.7)
Neutrophils Relative %: 91 %
Platelet Count: 272 10*3/uL (ref 150–400)
RBC: 3.63 MIL/uL — ABNORMAL LOW (ref 4.22–5.81)
RDW: 16.6 % — ABNORMAL HIGH (ref 11.5–15.5)
WBC Count: 28.1 10*3/uL — ABNORMAL HIGH (ref 4.0–10.5)
nRBC: 0 % (ref 0.0–0.2)

## 2022-06-02 LAB — CMP (CANCER CENTER ONLY)
ALT: 15 U/L (ref 0–44)
AST: 14 U/L — ABNORMAL LOW (ref 15–41)
Albumin: 3.3 g/dL — ABNORMAL LOW (ref 3.5–5.0)
Alkaline Phosphatase: 152 U/L — ABNORMAL HIGH (ref 38–126)
Anion gap: 7 (ref 5–15)
BUN: 10 mg/dL (ref 8–23)
CO2: 26 mmol/L (ref 22–32)
Calcium: 8.4 mg/dL — ABNORMAL LOW (ref 8.9–10.3)
Chloride: 109 mmol/L (ref 98–111)
Creatinine: 0.83 mg/dL (ref 0.61–1.24)
GFR, Estimated: >60 mL/min (ref >60)
Glucose, Bld: 188 mg/dL — ABNORMAL HIGH (ref 70–99)
Potassium: 3.7 mmol/L (ref 3.5–5.1)
Sodium: 142 mmol/L (ref 135–145)
Total Bilirubin: 0.2 mg/dL — ABNORMAL LOW (ref 0.3–1.2)
Total Protein: 6.4 g/dL — ABNORMAL LOW (ref 6.5–8.1)

## 2022-06-04 ENCOUNTER — Other Ambulatory Visit: Payer: Self-pay | Admitting: Radiology
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
## 2022-06-07 ENCOUNTER — Other Ambulatory Visit: Payer: Self-pay

## 2022-06-07 ENCOUNTER — Ambulatory Visit (HOSPITAL_COMMUNITY)
Admission: RE | Admit: 2022-06-07 | Discharge: 2022-06-07 | Disposition: A | Discharge status: Home / Self Care | Payer: Medicare PPO | Source: Ambulatory Visit | Attending: Physician Assistant | Admitting: Physician Assistant

## 2022-06-07 ENCOUNTER — Encounter (HOSPITAL_COMMUNITY): Payer: Self-pay

## 2022-06-07 DIAGNOSIS — C3492 Malignant neoplasm of unspecified part of left bronchus or lung: Secondary | ICD-10-CM | POA: Diagnosis not present

## 2022-06-07 DIAGNOSIS — E785 Hyperlipidemia, unspecified: Secondary | ICD-10-CM | POA: Insufficient documentation

## 2022-06-07 DIAGNOSIS — Z452 Encounter for adjustment and management of vascular access device: Secondary | ICD-10-CM | POA: Diagnosis not present

## 2022-06-07 DIAGNOSIS — I10 Essential (primary) hypertension: Secondary | ICD-10-CM | POA: Diagnosis not present

## 2022-06-07 DIAGNOSIS — Z87891 Personal history of nicotine dependence: Secondary | ICD-10-CM | POA: Diagnosis not present

## 2022-06-07 DIAGNOSIS — I255 Ischemic cardiomyopathy: Secondary | ICD-10-CM | POA: Diagnosis not present

## 2022-06-07 DIAGNOSIS — I2510 Atherosclerotic heart disease of native coronary artery without angina pectoris: Secondary | ICD-10-CM | POA: Insufficient documentation

## 2022-06-07 HISTORY — PX: IR IMAGING GUIDED PORT INSERTION: IMG5740

## 2022-06-07 LAB — GLUCOSE, CAPILLARY: Glucose-Capillary: 130 mg/dL — ABNORMAL HIGH (ref 70–99)

## 2022-06-07 MED ORDER — FENTANYL CITRATE (PF) 100 MCG/2ML IJ SOLN
INTRAMUSCULAR | Status: AC
  Filled 2022-06-07: qty 2

## 2022-06-07 MED ORDER — SODIUM CHLORIDE 0.9 % IV SOLN: INTRAVENOUS | Status: DC

## 2022-06-07 MED ORDER — LIDOCAINE-EPINEPHRINE 1 %-1:100000 IJ SOLN
INTRAMUSCULAR | Status: AC | PRN
  Administered 2022-06-07: 10 mL via INTRADERMAL

## 2022-06-07 MED ORDER — LIDOCAINE-EPINEPHRINE 1 %-1:100000 IJ SOLN
INTRAMUSCULAR | Status: AC
  Filled 2022-06-07: qty 1

## 2022-06-07 MED ORDER — MIDAZOLAM HCL 2 MG/2ML IJ SOLN
INTRAMUSCULAR | Status: AC
  Filled 2022-06-07: qty 2

## 2022-06-07 MED ORDER — MIDAZOLAM HCL 2 MG/2ML IJ SOLN
INTRAMUSCULAR | Status: AC | PRN
  Administered 2022-06-07: .5 mg via INTRAVENOUS

## 2022-06-07 MED ORDER — HEPARIN SOD (PORK) LOCK FLUSH 100 UNIT/ML IV SOLN
INTRAVENOUS | Status: AC
  Administered 2022-06-07: 500 [IU] via INTRAVENOUS
  Filled 2022-06-07: qty 5

## 2022-06-07 MED ORDER — FENTANYL CITRATE (PF) 100 MCG/2ML IJ SOLN
INTRAMUSCULAR | Status: AC | PRN
  Administered 2022-06-07: 50 ug via INTRAVENOUS

## 2022-06-07 MED FILL — Dexamethasone Sodium Phosphate Inj 100 MG/10ML: INTRAMUSCULAR | Qty: 1 | Status: AC

## 2022-06-07 MED FILL — Fosaprepitant Dimeglumine For IV Infusion 150 MG (Base Eq): INTRAVENOUS | Qty: 5 | Status: AC

## 2022-06-07 NOTE — Procedures (Signed)
Interventional Radiology Procedure Note  Procedure: Placement of a right IJ approach single lumen PowerPort.  Tip is positioned at the superior cavoatrial junction and catheter is ready for immediate use.  Complications: No immediate Recommendations:  - Ok to shower tomorrow - Do not submerge for 7 days - Routine line care   Signed,  Shone Leventhal K. Kamea Dacosta, MD   

## 2022-06-08 ENCOUNTER — Inpatient Hospital Stay: Payer: Medicare PPO

## 2022-06-08 ENCOUNTER — Inpatient Hospital Stay (HOSPITAL_BASED_OUTPATIENT_CLINIC_OR_DEPARTMENT_OTHER): Payer: Medicare PPO | Admitting: Internal Medicine

## 2022-06-08 VITALS — BP 120/87 | HR 92 | Temp 97.3°F | Resp 18 | Wt 121.5 lb

## 2022-06-08 DIAGNOSIS — C3492 Malignant neoplasm of unspecified part of left bronchus or lung: Secondary | ICD-10-CM

## 2022-06-08 DIAGNOSIS — C3412 Malignant neoplasm of upper lobe, left bronchus or lung: Secondary | ICD-10-CM | POA: Diagnosis not present

## 2022-06-08 DIAGNOSIS — Z5112 Encounter for antineoplastic immunotherapy: Secondary | ICD-10-CM | POA: Diagnosis not present

## 2022-06-08 DIAGNOSIS — C3490 Malignant neoplasm of unspecified part of unspecified bronchus or lung: Secondary | ICD-10-CM | POA: Diagnosis not present

## 2022-06-08 DIAGNOSIS — Z5111 Encounter for antineoplastic chemotherapy: Secondary | ICD-10-CM

## 2022-06-08 DIAGNOSIS — Z79899 Other long term (current) drug therapy: Secondary | ICD-10-CM | POA: Diagnosis not present

## 2022-06-08 LAB — CMP (CANCER CENTER ONLY)
ALT: 10 U/L (ref 0–44)
AST: 11 U/L — ABNORMAL LOW (ref 15–41)
Albumin: 3.3 g/dL — ABNORMAL LOW (ref 3.5–5.0)
Alkaline Phosphatase: 143 U/L — ABNORMAL HIGH (ref 38–126)
Anion gap: 7 (ref 5–15)
BUN: 12 mg/dL (ref 8–23)
CO2: 26 mmol/L (ref 22–32)
Calcium: 8.8 mg/dL — ABNORMAL LOW (ref 8.9–10.3)
Chloride: 106 mmol/L (ref 98–111)
Creatinine: 0.81 mg/dL (ref 0.61–1.24)
GFR, Estimated: >60 mL/min (ref >60)
Glucose, Bld: 160 mg/dL — ABNORMAL HIGH (ref 70–99)
Potassium: 3.9 mmol/L (ref 3.5–5.1)
Sodium: 139 mmol/L (ref 135–145)
Total Bilirubin: 0.3 mg/dL (ref 0.3–1.2)
Total Protein: 6.7 g/dL (ref 6.5–8.1)

## 2022-06-08 LAB — TSH: TSH: 1.863 u[IU]/mL (ref 0.350–4.500)

## 2022-06-08 LAB — CBC WITH DIFFERENTIAL (CANCER CENTER ONLY)
Abs Immature Granulocytes: 0.54 10*3/uL — ABNORMAL HIGH (ref 0.00–0.07)
Basophils Absolute: 0.1 10*3/uL (ref 0.0–0.1)
Basophils Relative: 0 %
Eosinophils Absolute: 0.1 10*3/uL (ref 0.0–0.5)
Eosinophils Relative: 0 %
HCT: 31.6 % — ABNORMAL LOW (ref 39.0–52.0)
Hemoglobin: 10.4 g/dL — ABNORMAL LOW (ref 13.0–17.0)
Immature Granulocytes: 1 %
Lymphocytes Relative: 2 %
Lymphs Abs: 0.8 10*3/uL (ref 0.7–4.0)
MCH: 27 pg (ref 26.0–34.0)
MCHC: 32.9 g/dL (ref 30.0–36.0)
MCV: 82.1 fL (ref 80.0–100.0)
Monocytes Absolute: 1.4 10*3/uL — ABNORMAL HIGH (ref 0.1–1.0)
Monocytes Relative: 3 %
Neutro Abs: 37.4 10*3/uL — ABNORMAL HIGH (ref 1.7–7.7)
Neutrophils Relative %: 94 %
Platelet Count: 267 10*3/uL (ref 150–400)
RBC: 3.85 MIL/uL — ABNORMAL LOW (ref 4.22–5.81)
RDW: 17.6 % — ABNORMAL HIGH (ref 11.5–15.5)
WBC Count: 40.3 10*3/uL — ABNORMAL HIGH (ref 4.0–10.5)
nRBC: 0 % (ref 0.0–0.2)

## 2022-06-08 MED ORDER — SODIUM CHLORIDE 0.9 % IV SOLN
370.0000 mg | Freq: Once | INTRAVENOUS | Status: AC
  Administered 2022-06-08: 370 mg via INTRAVENOUS
  Filled 2022-06-08: qty 37

## 2022-06-08 MED ORDER — SODIUM CHLORIDE 0.9 % IV SOLN
10.0000 mg | Freq: Once | INTRAVENOUS | Status: AC
  Administered 2022-06-08: 10 mg via INTRAVENOUS
  Filled 2022-06-08: qty 10

## 2022-06-08 MED ORDER — SODIUM CHLORIDE 0.9 % IV SOLN: Freq: Once | INTRAVENOUS | Status: AC

## 2022-06-08 MED ORDER — FAMOTIDINE IN NACL 20-0.9 MG/50ML-% IV SOLN
20.0000 mg | Freq: Once | INTRAVENOUS | Status: AC
  Administered 2022-06-08: 20 mg via INTRAVENOUS
  Filled 2022-06-08: qty 50

## 2022-06-08 MED ORDER — HEPARIN SOD (PORK) LOCK FLUSH 100 UNIT/ML IV SOLN
500.0000 [IU] | Freq: Once | INTRAVENOUS | Status: AC | PRN
  Administered 2022-06-08: 500 [IU]

## 2022-06-08 MED ORDER — PALONOSETRON HCL INJECTION 0.25 MG/5ML
0.2500 mg | Freq: Once | INTRAVENOUS | Status: AC
  Administered 2022-06-08: 0.25 mg via INTRAVENOUS
  Filled 2022-06-08: qty 5

## 2022-06-08 MED ORDER — SODIUM CHLORIDE 0.9% FLUSH
10.0000 mL | INTRAVENOUS | Status: DC | PRN
  Administered 2022-06-08: 10 mL

## 2022-06-08 MED ORDER — DIPHENHYDRAMINE HCL 50 MG/ML IJ SOLN
50.0000 mg | Freq: Once | INTRAMUSCULAR | Status: AC
  Administered 2022-06-08: 50 mg via INTRAVENOUS
  Filled 2022-06-08: qty 1

## 2022-06-08 MED ORDER — SODIUM CHLORIDE 0.9 % IV SOLN
150.0000 mg | Freq: Once | INTRAVENOUS | Status: AC
  Administered 2022-06-08: 150 mg via INTRAVENOUS
  Filled 2022-06-08: qty 150

## 2022-06-08 MED ORDER — SODIUM CHLORIDE 0.9 % IV SOLN
175.0000 mg/m2 | Freq: Once | INTRAVENOUS | Status: AC
  Administered 2022-06-08: 288 mg via INTRAVENOUS
  Filled 2022-06-08: qty 48

## 2022-06-08 MED ORDER — SODIUM CHLORIDE 0.9 % IV SOLN
200.0000 mg | Freq: Once | INTRAVENOUS | Status: AC
  Administered 2022-06-08: 200 mg via INTRAVENOUS
  Filled 2022-06-08: qty 200

## 2022-06-08 NOTE — Patient Instructions (Signed)
[facility]  Discharge Instructions: Thank you for choosing [facility] to provide your oncology and hematology care.   If you have a lab appointment with the [facility], please go directly to the [facility] and check in at the registration area.   Wear comfortable clothing and clothing appropriate for easy access to any Portacath or PICC line.   We strive to give you quality time with your provider. You may need to reschedule your appointment if you arrive late (15 or more minutes).  Arriving late affects you and other patients whose appointments are after yours.  Also, if you miss three or more appointments without notifying the office, you may be dismissed from the clinic at the provider's discretion.      For prescription refill requests, have your pharmacy contact our office and allow 72 hours for refills to be completed.    Today you received the following chemotherapy and/or immunotherapy agents: Pembrolizumab (Keytruda), Paclitaxel (Taxol), and Carboplatin.   To help prevent nausea and vomiting after your treatment, we encourage you to take your nausea medication as directed.  BELOW ARE SYMPTOMS THAT SHOULD BE REPORTED IMMEDIATELY: *FEVER GREATER THAN 100.4 F (38 C) OR HIGHER *CHILLS OR SWEATING *NAUSEA AND VOMITING THAT IS NOT CONTROLLED WITH YOUR NAUSEA MEDICATION *UNUSUAL SHORTNESS OF BREATH *UNUSUAL BRUISING OR BLEEDING *URINARY PROBLEMS (pain or burning when urinating, or frequent urination) *BOWEL PROBLEMS (unusual diarrhea, constipation, pain near the anus) TENDERNESS IN MOUTH AND THROAT WITH OR WITHOUT PRESENCE OF ULCERS (sore throat, sores in mouth, or a toothache) UNUSUAL RASH, SWELLING OR PAIN  UNUSUAL VAGINAL DISCHARGE OR ITCHING   Items with * indicate a potential emergency and should be followed up as soon as possible or go to the Emergency Department if any problems should occur.  Please show the CHEMOTHERAPY  ALERT CARD or IMMUNOTHERAPY ALERT CARD at check-in to the Emergency Department and triage nurse.  Should you have questions after your visit or need to cancel or reschedule your appointment, please contact [facility]  Dept: [phone]  and follow the prompts.  Office hours are 8:00 a.m. to 4:30 p.m. Monday - Friday. Please note that voicemails left after 4:00 p.m. may not be returned until the following business day.  We are closed weekends and major holidays. You have access to a nurse at all times for urgent questions. Please call the main number to the clinic Dept: [phone] and follow the prompts.   For any non-urgent questions, you may also contact your provider using MyChart. We now offer e-Visits for anyone 58 and older to request care online for non-urgent symptoms. For details visit [email or web address].   Also download the MyChart app! Go to the app store, search "MyChart", open the app, select [facility], and log in with your MyChart username and password.  Masks are optional in the cancer centers. If you would like for your care team to wear a mask while they are taking care of you, please let them know. For doctor visits, patients may have with them one support Geraldyne Barraclough who is at least 77 years old. At this time, visitors are not allowed in the infusion area.
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
## 2022-06-10 ENCOUNTER — Ambulatory Visit: Payer: Medicare PPO

## 2022-06-15 ENCOUNTER — Other Ambulatory Visit: Payer: Medicare PPO

## 2022-06-15 ENCOUNTER — Inpatient Hospital Stay: Payer: Medicare PPO

## 2022-06-21 ENCOUNTER — Other Ambulatory Visit: Payer: Self-pay

## 2022-06-21 DIAGNOSIS — I469 Cardiac arrest, cause unspecified: Secondary | ICD-10-CM | POA: Diagnosis not present

## 2022-06-21 DIAGNOSIS — R509 Fever, unspecified: Secondary | ICD-10-CM | POA: Diagnosis not present

## 2022-06-22 ENCOUNTER — Other Ambulatory Visit: Payer: Self-pay | Admitting: Physician Assistant

## 2022-06-22 ENCOUNTER — Inpatient Hospital Stay: Payer: Medicare PPO

## 2022-06-22 ENCOUNTER — Other Ambulatory Visit: Payer: Medicare PPO

## 2022-06-26 ENCOUNTER — Encounter: Payer: Self-pay | Admitting: Pulmonary Disease

## 2022-06-26 ENCOUNTER — Encounter: Payer: Self-pay | Admitting: Internal Medicine

## 2022-06-28 NOTE — Telephone Encounter (Signed)
This is an [facility] patient and letting you know as you are covering per the note. FYI for deceased.

## 2022-06-29 ENCOUNTER — Inpatient Hospital Stay: Payer: Medicare PPO

## 2022-06-29 ENCOUNTER — Other Ambulatory Visit: Payer: Medicare PPO

## 2022-06-29 ENCOUNTER — Inpatient Hospital Stay: Payer: Medicare PPO | Admitting: Internal Medicine

## 2022-06-29 DEATH — deceased

## 2022-07-06 ENCOUNTER — Other Ambulatory Visit: Payer: Medicare PPO

## 2022-07-13 ENCOUNTER — Other Ambulatory Visit: Payer: Medicare PPO

## 2022-07-21 ENCOUNTER — Other Ambulatory Visit: Payer: Medicare PPO

## 2022-07-21 ENCOUNTER — Ambulatory Visit: Payer: Medicare PPO

## 2022-07-21 ENCOUNTER — Ambulatory Visit: Payer: Medicare PPO | Admitting: Internal Medicine

## 2022-07-27 ENCOUNTER — Other Ambulatory Visit: Payer: Medicare PPO

## 2022-08-03 ENCOUNTER — Other Ambulatory Visit: Payer: Medicare PPO

## 2022-08-10 ENCOUNTER — Ambulatory Visit: Payer: Medicare PPO | Admitting: Internal Medicine

## 2022-08-10 ENCOUNTER — Other Ambulatory Visit: Payer: Medicare PPO

## 2022-08-10 ENCOUNTER — Ambulatory Visit: Payer: Medicare PPO

## 2022-08-17 ENCOUNTER — Other Ambulatory Visit: Payer: Medicare PPO
